# Patient Record
Sex: Female | Born: 1940 | Race: White | Hispanic: No | Marital: Married | Smoking: Never smoker
Health system: Southern US, Community
[De-identification: ages and names within clinical notes are randomized; demographics above are authoritative.]

## PROBLEM LIST (undated history)

## (undated) DIAGNOSIS — M8580 Other specified disorders of bone density and structure, unspecified site: Secondary | ICD-10-CM

## (undated) DIAGNOSIS — E559 Vitamin D deficiency, unspecified: Secondary | ICD-10-CM

## (undated) DIAGNOSIS — Q899 Congenital malformation, unspecified: Secondary | ICD-10-CM

## (undated) DIAGNOSIS — K219 Gastro-esophageal reflux disease without esophagitis: Secondary | ICD-10-CM

## (undated) DIAGNOSIS — K589 Irritable bowel syndrome without diarrhea: Secondary | ICD-10-CM

## (undated) DIAGNOSIS — G839 Paralytic syndrome, unspecified: Secondary | ICD-10-CM

## (undated) DIAGNOSIS — T7840XA Allergy, unspecified, initial encounter: Secondary | ICD-10-CM

## (undated) HISTORY — DX: Congenital malformation, unspecified: Q89.9

## (undated) HISTORY — DX: Irritable bowel syndrome, unspecified: K58.9

## (undated) HISTORY — DX: Allergy, unspecified, initial encounter: T78.40XA

## (undated) HISTORY — PX: CATARACT EXTRACTION: SUR2

## (undated) HISTORY — PX: CHOLECYSTECTOMY: SHX55

## (undated) HISTORY — DX: Vitamin D deficiency, unspecified: E55.9

## (undated) HISTORY — DX: Gastro-esophageal reflux disease without esophagitis: K21.9

## (undated) HISTORY — DX: Paralytic syndrome, unspecified: G83.9

## (undated) HISTORY — DX: Other specified disorders of bone density and structure, unspecified site: M85.80

---

## 1999-01-05 ENCOUNTER — Emergency Department (HOSPITAL_COMMUNITY): Admission: EM | Admit: 1999-01-05 | Discharge: 1999-01-05 | Payer: Self-pay | Admitting: *Deleted

## 1999-01-05 ENCOUNTER — Encounter: Payer: Self-pay | Admitting: Emergency Medicine

## 2001-02-26 ENCOUNTER — Emergency Department (HOSPITAL_COMMUNITY): Admission: EM | Admit: 2001-02-26 | Discharge: 2001-02-27 | Payer: Self-pay | Admitting: Emergency Medicine

## 2001-02-26 ENCOUNTER — Encounter: Payer: Self-pay | Admitting: Emergency Medicine

## 2002-05-03 ENCOUNTER — Encounter: Payer: Self-pay | Admitting: Orthopedic Surgery

## 2002-05-03 ENCOUNTER — Encounter: Admission: RE | Admit: 2002-05-03 | Discharge: 2002-05-03 | Payer: Self-pay | Admitting: Orthopedic Surgery

## 2003-07-12 ENCOUNTER — Other Ambulatory Visit: Admission: RE | Admit: 2003-07-12 | Discharge: 2003-07-12 | Payer: Self-pay | Admitting: Family Medicine

## 2003-10-12 ENCOUNTER — Ambulatory Visit (HOSPITAL_COMMUNITY): Admission: RE | Admit: 2003-10-12 | Discharge: 2003-10-12 | Payer: Self-pay | Admitting: Gastroenterology

## 2003-12-28 ENCOUNTER — Encounter: Admission: RE | Admit: 2003-12-28 | Discharge: 2003-12-28 | Payer: Self-pay | Admitting: Specialist

## 2004-05-25 HISTORY — PX: JOINT REPLACEMENT: SHX530

## 2005-03-16 ENCOUNTER — Inpatient Hospital Stay (HOSPITAL_COMMUNITY): Admission: RE | Admit: 2005-03-16 | Discharge: 2005-03-21 | Payer: Self-pay | Admitting: Orthopedic Surgery

## 2005-03-21 ENCOUNTER — Ambulatory Visit: Payer: Self-pay | Admitting: Physical Medicine & Rehabilitation

## 2005-03-21 ENCOUNTER — Inpatient Hospital Stay
Admission: RE | Admit: 2005-03-21 | Discharge: 2005-03-28 | Payer: Self-pay | Admitting: Physical Medicine & Rehabilitation

## 2005-07-05 ENCOUNTER — Emergency Department (HOSPITAL_COMMUNITY): Admission: EM | Admit: 2005-07-05 | Discharge: 2005-07-05 | Payer: Self-pay | Admitting: Emergency Medicine

## 2005-12-11 ENCOUNTER — Ambulatory Visit: Payer: Self-pay | Admitting: Family Medicine

## 2006-03-30 ENCOUNTER — Ambulatory Visit: Payer: Self-pay | Admitting: Family Medicine

## 2006-04-07 ENCOUNTER — Ambulatory Visit: Payer: Self-pay | Admitting: Family Medicine

## 2006-09-29 ENCOUNTER — Ambulatory Visit: Payer: Self-pay | Admitting: Family Medicine

## 2006-09-30 ENCOUNTER — Encounter: Admission: RE | Admit: 2006-09-30 | Discharge: 2006-09-30 | Payer: Self-pay | Admitting: Family Medicine

## 2007-06-13 ENCOUNTER — Ambulatory Visit: Payer: Self-pay | Admitting: Family Medicine

## 2007-06-27 ENCOUNTER — Ambulatory Visit: Payer: Self-pay | Admitting: Family Medicine

## 2007-08-08 ENCOUNTER — Ambulatory Visit: Payer: Self-pay | Admitting: Family Medicine

## 2007-09-26 ENCOUNTER — Ambulatory Visit: Payer: Self-pay | Admitting: Family Medicine

## 2007-09-26 ENCOUNTER — Other Ambulatory Visit: Admission: RE | Admit: 2007-09-26 | Discharge: 2007-09-26 | Payer: Self-pay | Admitting: Family Medicine

## 2007-10-07 ENCOUNTER — Ambulatory Visit: Payer: Self-pay | Admitting: Family Medicine

## 2008-05-25 DIAGNOSIS — E559 Vitamin D deficiency, unspecified: Secondary | ICD-10-CM

## 2008-05-25 HISTORY — DX: Vitamin D deficiency, unspecified: E55.9

## 2008-07-27 ENCOUNTER — Emergency Department (HOSPITAL_COMMUNITY): Admission: EM | Admit: 2008-07-27 | Discharge: 2008-07-27 | Payer: Self-pay | Admitting: Emergency Medicine

## 2008-09-26 ENCOUNTER — Ambulatory Visit: Payer: Self-pay | Admitting: Family Medicine

## 2008-11-28 ENCOUNTER — Ambulatory Visit: Payer: Self-pay | Admitting: Family Medicine

## 2009-04-09 ENCOUNTER — Ambulatory Visit: Payer: Self-pay | Admitting: Family Medicine

## 2009-06-10 ENCOUNTER — Ambulatory Visit: Payer: Self-pay | Admitting: Family Medicine

## 2009-08-08 ENCOUNTER — Ambulatory Visit: Payer: Self-pay | Admitting: Family Medicine

## 2009-08-19 ENCOUNTER — Encounter: Payer: Self-pay | Admitting: Cardiology

## 2009-09-25 ENCOUNTER — Ambulatory Visit: Payer: Self-pay | Admitting: Physician Assistant

## 2009-10-30 ENCOUNTER — Ambulatory Visit: Payer: Self-pay | Admitting: Family Medicine

## 2009-12-02 ENCOUNTER — Ambulatory Visit: Payer: Self-pay | Admitting: Family Medicine

## 2010-01-14 ENCOUNTER — Ambulatory Visit: Payer: Self-pay | Admitting: Family Medicine

## 2010-05-02 ENCOUNTER — Ambulatory Visit: Payer: Self-pay | Admitting: Family Medicine

## 2010-07-07 ENCOUNTER — Ambulatory Visit (INDEPENDENT_AMBULATORY_CARE_PROVIDER_SITE_OTHER): Payer: MEDICARE | Admitting: Physician Assistant

## 2010-07-07 DIAGNOSIS — E559 Vitamin D deficiency, unspecified: Secondary | ICD-10-CM

## 2010-07-07 DIAGNOSIS — J309 Allergic rhinitis, unspecified: Secondary | ICD-10-CM

## 2010-08-27 ENCOUNTER — Ambulatory Visit (INDEPENDENT_AMBULATORY_CARE_PROVIDER_SITE_OTHER): Payer: MEDICARE | Admitting: Family Medicine

## 2010-08-27 DIAGNOSIS — J309 Allergic rhinitis, unspecified: Secondary | ICD-10-CM

## 2010-08-27 DIAGNOSIS — R05 Cough: Secondary | ICD-10-CM

## 2010-09-13 ENCOUNTER — Encounter: Payer: Self-pay | Admitting: Family Medicine

## 2010-09-13 DIAGNOSIS — N390 Urinary tract infection, site not specified: Secondary | ICD-10-CM | POA: Insufficient documentation

## 2010-09-13 DIAGNOSIS — K529 Noninfective gastroenteritis and colitis, unspecified: Secondary | ICD-10-CM | POA: Insufficient documentation

## 2010-09-19 ENCOUNTER — Ambulatory Visit
Admission: RE | Admit: 2010-09-19 | Discharge: 2010-09-19 | Disposition: A | Discharge status: Home / Self Care | Payer: Medicare Other | Source: Ambulatory Visit | Attending: Family Medicine | Admitting: Family Medicine

## 2010-09-19 ENCOUNTER — Other Ambulatory Visit: Payer: Self-pay | Admitting: Family Medicine

## 2010-09-19 DIAGNOSIS — R05 Cough: Secondary | ICD-10-CM

## 2010-10-09 ENCOUNTER — Ambulatory Visit (INDEPENDENT_AMBULATORY_CARE_PROVIDER_SITE_OTHER): Payer: MEDICARE | Admitting: Family Medicine

## 2010-10-09 ENCOUNTER — Other Ambulatory Visit (HOSPITAL_COMMUNITY)
Admission: RE | Admit: 2010-10-09 | Discharge: 2010-10-09 | Disposition: A | Discharge status: Home / Self Care | Payer: Medicare Other | Source: Ambulatory Visit | Attending: Family Medicine | Admitting: Family Medicine

## 2010-10-09 ENCOUNTER — Encounter: Payer: Self-pay | Admitting: Family Medicine

## 2010-10-09 ENCOUNTER — Encounter: Payer: Medicare Other | Admitting: Family Medicine

## 2010-10-09 VITALS — BP 154/60 | HR 64 | Ht 62.0 in | Wt 172.0 lb

## 2010-10-09 DIAGNOSIS — M899 Disorder of bone, unspecified: Secondary | ICD-10-CM

## 2010-10-09 DIAGNOSIS — Z79899 Other long term (current) drug therapy: Secondary | ICD-10-CM

## 2010-10-09 DIAGNOSIS — K219 Gastro-esophageal reflux disease without esophagitis: Secondary | ICD-10-CM

## 2010-10-09 DIAGNOSIS — Z124 Encounter for screening for malignant neoplasm of cervix: Secondary | ICD-10-CM | POA: Insufficient documentation

## 2010-10-09 DIAGNOSIS — M8580 Other specified disorders of bone density and structure, unspecified site: Secondary | ICD-10-CM

## 2010-10-09 DIAGNOSIS — E559 Vitamin D deficiency, unspecified: Secondary | ICD-10-CM

## 2010-10-09 DIAGNOSIS — Z0000 Encounter for general adult medical examination without abnormal findings: Secondary | ICD-10-CM

## 2010-10-09 LAB — POCT URINALYSIS DIPSTICK
Blood, UA: NEGATIVE
Glucose, UA: NEGATIVE
Spec Grav, UA: 1.01
Urobilinogen, UA: NEGATIVE

## 2010-10-09 LAB — COMPREHENSIVE METABOLIC PANEL
ALT: 10 U/L (ref 0–35)
Albumin: 4.1 g/dL (ref 3.5–5.2)
CO2: 24 mEq/L (ref 19–32)
Calcium: 9.3 mg/dL (ref 8.4–10.5)
Chloride: 104 mEq/L (ref 96–112)
Creat: 0.79 mg/dL (ref 0.40–1.20)
Potassium: 4.4 mEq/L (ref 3.5–5.3)
Total Protein: 6.7 g/dL (ref 6.0–8.3)

## 2010-10-09 MED ORDER — MELOXICAM 7.5 MG PO TABS: 7.5000 mg | ORAL_TABLET | Freq: Two times a day (BID) | ORAL | Status: DC

## 2010-10-09 MED ORDER — RISEDRONATE SODIUM 35 MG PO TABS: 35.0000 mg | ORAL_TABLET | ORAL | Status: DC

## 2010-10-09 NOTE — Progress Notes (Signed)
Subjective:    Patient ID: [name], female    DOB: April 23, 1941, 70 y.o.   MRN: [id number]  HPI [name] is a 70 y.o. female who presents for a complete physical.  She has the following concerns: None.  Due for labs, needs copies of labs sent to Dr. Despina Hick   Immunization History  Administered Date(s) Administered  . DTaP 05/07/1992, 05/25/1994, 07/09/2003  . Pneumococcal Conjugate 03/19/2005  . Zoster 04/05/2010   Gets flu shots annually Last Pap smear: 2009 Last mammogram: August 2011 Last colonoscopy: approx 2006 per pt, with Dr. Loreta Ave Last DEXA: 01/2010 Ophtho: scheduled in near future Dentist: last year Exercise: Rides exercise bike 30 minutes, at least 2 times a day (over an hour a day) Lipids 09/2009 normal (LDL always <130)   Review of Systems The patient denies anorexia, fever, weight changes, headaches,  vision changes, decreased hearing, ear pain, sore throat, breast concerns, chest pain, palpitations, dizziness, syncope, dyspnea on exertion, cough, swelling, nausea, vomiting, diarrhea, constipation, abdominal pain, melena, hematochezia, indigestion/heartburn, hematuria, incontinence, dysuria, irregular menstrual cycles, vaginal discharge, odor or itch, genital lesions, numbness, tingling, weakness, tremor, suspicious skin lesions, depression, anxiety, abnormal bleeding/bruising, or enlarged lymph nodes.  +arthritis in knees     Objective:   Physical Exam BP 154/60  Pulse 64  Ht 5\' 2"  (1.575 m)  Wt 172 lb (78.019 kg)  BMI 31.46 kg/m2  General Appearance:    Alert, cooperative, no distress, appears stated age.  Frequent throat clearing  Head:    Normocephalic, without obvious abnormality, atraumatic  Eyes:    PERRL, conjunctiva/corneas clear, EOM's intact, fundi    benign  Ears:    Normal TM's and external ear canals  Nose:   Nares normal, mucosa normal, no drainage or sinus   tenderness  Throat:   Lips, mucosa, and tongue normal; teeth and gums normal    Neck:   Supple, no lymphadenopathy;  thyroid:  no   enlargement/tenderness/nodules; no carotid   bruit or JVD  Back:    Spine nontender, no curvature, ROM normal, no CVA     tenderness  Lungs:     Clear to auscultation bilaterally without wheezes, rales or     ronchi; respirations unlabored  Chest Wall:    No tenderness or deformity   Heart:    Regular rate and rhythm, S1 and S2 normal, no murmur, rub   or gallop  Breast Exam:    No tenderness, masses, or nipple discharge or inversion.      No axillary lymphadenopathy  Abdomen:     Soft, non-tender, nondistended, normoactive bowel sounds,    no masses, no hepatosplenomegaly  Genitalia:    Normal external genitalia without lesions.  BUS and vagina normal; cervix without lesions, or cervical motion tenderness. Mild atrophic changes. No abnormal vaginal discharge.  Uterus and adnexa not enlarged, nontender, no masses.  Pap performed  Rectal:    Normal tone, no masses or tenderness; guaiac negative stool  Extremities:   No clubbing, cyanosis or edema. Very limited ROM of L shoulder (<90 degrees)  Pulses:   2+ and symmetric all extremities  Skin:   Skin color, texture, turgor normal, no rashes or lesions  Lymph nodes:   Cervical, supraclavicular, and axillary nodes normal  Neurologic:   CNII-XII intact, reflexes 2+ and symmetric throughout. Alert and oriented x 3          Psych:   Normal mood, affect, hygiene and grooming.  Assessment & Plan:   1. Routine general medical examination at a health care facility  POCT urinalysis dipstick, Visual acuity screening, Cytology - PAP  2. Unspecified vitamin D deficiency  Vitamin D 25 hydroxy  3. GERD (gastroesophageal reflux disease)    4. Osteopenia    5. Encounter for long-term (current) use of other medications  Comprehensive metabolic panel   BP elevated today (although lower on repeat, still elevated).  BP was normal at her last 2 visits.  Will continue to monitor.  Low sodium diet  reviewed. Allergies--causing PND and throat clearing. Discussed OTC antihistamines such as Claritin, avoid decongestants; use Mucinex prn  Send copies of labs to Dr. Despina Hick Hemoccult kit given to patient.  Discussed monthly self breast exams and yearly mammograms after the age of 58; at least 30 minutes of aerobic activity at least 5 days/week; proper sunscreen use reviewed; healthy diet, including goals of calcium and vitamin D intake and alcohol recommendations (less than or equal to 1 drink/day) reviewed; regular seatbelt use; changing batteries in smoke detectors.  Immunization recommendations discussed.  Colonoscopy recommendations reviewed

## 2010-10-10 ENCOUNTER — Encounter: Payer: Self-pay | Admitting: Family Medicine

## 2010-10-10 DIAGNOSIS — M8580 Other specified disorders of bone density and structure, unspecified site: Secondary | ICD-10-CM | POA: Insufficient documentation

## 2010-10-10 DIAGNOSIS — E559 Vitamin D deficiency, unspecified: Secondary | ICD-10-CM | POA: Insufficient documentation

## 2010-10-10 LAB — VITAMIN D 25 HYDROXY (VIT D DEFICIENCY, FRACTURES): Vit D, 25-Hydroxy: 18 ng/mL — ABNORMAL LOW (ref 30–89)

## 2010-10-10 NOTE — Op Note (Signed)
NAME:  [name], [name]                          ACCOUNT NO.:  [id number]   MEDICAL RECORD NO.:  [id number]                   PATIENT TYPE:  AMB   LOCATION:  ENDO                                 FACILITY:  [facility]   PHYSICIAN:  Anselmo Rod, M.D.               DATE OF BIRTH:  Sep 25, 1940   DATE OF PROCEDURE:  10/12/2003  DATE OF DISCHARGE:                                 OPERATIVE REPORT   PROCEDURE:  Screening colonoscopy.   INSTRUMENT USED:  Olympus video colonoscope.   INDICATIONS FOR PROCEDURE:  This is a 70 year old white female undergoing a  screening colonoscopy to rule out colonic polyps, masses, etc. She received  preparation.  Informed consent was secured from the patient.  The patient  was fasted for 8 hours prior to the procedure and prepped with a bottle of  magnesium citrate and a gallon of [place] the night prior to the  procedure.   PRE-PROCEDURE DIAGNOSIS:  Stable vital signs.  Neck supple.  Chest clear to  auscultation.  S1, S2 regular.  Abdomen is soft with normal bowel sounds.   PROCEDURE:  The patient is placed in the left lateral decubitus position and  sedated with 8 mg of morphine plus 2 mg of Versed intravenously.  Once the  patient was adequately sedated, maintained on low-flow oxygen and continuous  cardiac monitoring, the Olympus video colonoscope was advanced through the  rectum to the cecum with extreme difficulty.  The patient's position was  changed from the left lateral to the supine and the right lateral position,  with application of abdominal pressure on multiple occasions to reach the  cecum. The appendiceal orifice was visualized and so was the IC valve.  No  masses, polyps, erosions, ulcerations or diverticula were seen.  Retroflexion in the rectum revealed no masses.  The patient tolerated the  procedure well and without complications.   IMPRESSION:  Normal colonoscopy to the cecum except for a very tortuous  colon.  No mass, polyp or  diverticula seen.   RECOMMENDATIONS:  1. Repeat CRC screening as recommended for age over 42, unless the patient     develops any abdominal symptoms in the interim.  2. Outpatient followup is advised in the future.  Continue a high-fiber diet     appropriate fluid intake.                                               Anselmo Rod, M.D.    JNM/MEDQ  D:  10/12/2003  T:  10/14/2003  Job:  [id number]   cc:   Milon Dikes MD Elpidio Galea PA/  [facility]

## 2010-10-10 NOTE — H&P (Signed)
NAMEIMARI, [name] NO.:  [id number]   MEDICAL RECORD NO.:  [id number]          PATIENT TYPE:  ORB   LOCATION:  4505                         FACILITY:  [facility]   PHYSICIAN:  Erick Colace, M.D.DATE OF BIRTH:  1940/11/03   DATE OF ADMISSION:  03/21/2005  DATE OF DISCHARGE:                                HISTORY & PHYSICAL   HISTORY:  A 70 year old female admitted to [facility], March 16, 2005,  with end-stage changes in the left knee, DJD.  No relief with conservative  care, underwent a left TKA, March 16, 2005, per Dr. Ollen Gross.  Placed  on Coumadin for DVT prophylaxis and made weightbearing as tolerated,  postoperative anemia of 9.0 has been monitored.   REVIEW OF SYSTEMS:  Positive for joint swelling and reflux symptoms.   PAST MEDICAL HISTORY:  1.  GERD.  2.  History of vertebral fracture.  3.  History of left shoulder fracture.  4.  History of birth injury, left upper extremity, causing limitation of the      left shoulder as well as a shortened left arm, but no sensation changes.  5.  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  She does not recall whether this is cerebral palsy or some      other condition.  6.  Osteopenia.   PAST SURGICAL HISTORY:  1.  Cholecystectomy.  2.  Partial colectomy.  3.  Appendectomy.  4.  Heel cord lengthening on the right lower extremity.   FAMILY HISTORY:  Positive for hypertension.   SOCIAL HISTORY:  She lives with her husband and good family support.   FUNCTIONAL HISTORY:  Independent prior to admission.   FUNCTIONAL STATUS:  Needs assistance with ADLs and mobility and toileting.   HOME MEDICATIONS:  Actonel, aspirin, Os-Cal, and Omeprazole.   LABORATORY DATA:  Last hemoglobin 9.0, last BUN 9, and creatinine 0.9.  Last  INR 1.9.   PHYSICAL EXAMINATION:  VITAL SIGNS:  Blood pressure 113/66, heart rate 111,  O2 SAT 100% room air, temp 98.2.  NECK:  No JVD.  LUNGS:  Clear.  HEART:  Mild tachycardia, has been doing therapy.  ABDOMEN:  Positive bowel sounds, soft and nontender.  EXTREMITIES:  The left lower extremity has minimal peri-incisional erythema,  no induration, no joint swelling.  Motor strength is 2- in the left deltoid,  3- at the biceps, and the grip, right side, is 5 in the same muscle groups.  The left lower extremity has 1 at the hip flexor, 2- at the knee extensor,  and 4 in the ankle dorsiflexor.  The right lower extremity is 4- in the hip  flexor, knee extensor, and 2- at the ankle dorsiflexor.  Sensation intact,  bilateral upper and lower extremities.   IMPRESSION:  1.  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  2.  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  3.  Chronic right lower extremity spastic paresis, question cerebral palsy.  4.  History of gastroesophageal reflux disease.  Continue Protonix.  5.  Postoperative anemia.  Monitor, recheck hemoglobin in the morning.  6.  Deep vein thrombosis prophylaxis on Coumadin.   ESTIMATED LENGTH OF STAY:  More than likely to be 10 days given chronic  right lower extremity weakness as well as left shoulder limitation.   DISPOSITION:  Home with husband.  Patient is a good candidate for SACU  therapies.      Erick Colace, M.D.  Electronically Signed     AEK/MEDQ  D:  03/22/2005  T:  03/22/2005  Job:  [id number]   cc:   Ollen Gross, M.D.  Fax: [phone]

## 2010-10-10 NOTE — Op Note (Signed)
NAMEKYNLEE, KOENIGSBERG                ACCOUNT NO.:  [id number]   MEDICAL RECORD NO.:  [id number]          PATIENT TYPE:  INP   LOCATION:  0007                         FACILITY:  [facility]   PHYSICIAN:  Ollen Gross, M.D.    DATE OF BIRTH:  1940-11-08   DATE OF PROCEDURE:  03/16/2005  DATE OF DISCHARGE:                                 OPERATIVE REPORT   PREOPERATIVE DIAGNOSIS:  Osteoarthritis left knee.   POSTOPERATIVE DIAGNOSIS:  Osteoarthritis left knee.   PROCEDURE:  Left total knee arthroplasty.   SURGEON:  Dr. Lequita Halt   ASSISTANT:  Avel Peace, PA-C   ANESTHESIA:  General with postop Marcaine pain pump.   ESTIMATED BLOOD LOSS:  Minimal.   DRAINS:  Hemovac x 1.   TOURNIQUET TIME:  41 minutes at 300 mmHg.   COMPLICATIONS:  None.   CONDITION:  Stable to recovery.   BRIEF CLINICAL NOTE:  Ms. Alcaraz is a 70 year old female with severe  patellofemoral arthritis.  She has had several courses of Visicol supplement  injections but most recently has had no relief of the worsening pain.  She  has failed nonoperative management and presents now for left total knee  arthroplasty.   PROCEDURE IN DETAIL:  After the successful administration of general  anesthetic, a tourniquet is placed high on the left thigh and left lower  extremity prepped and draped in the usual sterile fashion.  Extremity is  wrapped in Esmarch, knee flexed, and tourniquet inflated to 300 mmHg.  Standard midline incision is made with a 10 blade through subcutaneous  tissue to the level of the extensor mechanism.  A fresh blade is used to  make a medial parapatellar arthrotomy.  Then the soft tissue over the  proximal medial tibia is subperiosteally elevated to the joint line with a  knife and into the semimembranosus bursa with a Cobb elevator.  Soft tissue  over the proximal and lateral tibia is also elevated with attention being  paid to avoiding the patellar tendon on tibial tubercle.  The patella was  everted, knee flexed 90 degrees, and ACL and PCL removed.  Drill was used to  create a starting hole in the distal femur, and canal was irrigated.  A 5-  degree left valgus alignment guide is placed and referencing off the  posterior condyles, rotation is marked and the block pinned to remove 10 mm  off the distal femur.  Distal femoral resection is made with an oscillating  saw.  The sizing block is placed, and size 3 is most appropriate.  The  rotation is marked off the epicondylar axis.  Size 3 cutting block is  placed, and the anterior, posterior, and chamfer cuts are made.   Tibia is subluxed forward and the menisci are removed.  Extramedullary  tibial alignment guide is placed, referencing proximally at the medial  aspect of the tibial tubercle and distally along the second metatarsal axis  and tibial crest.  The block is pinned to remove 10 mm off the non deficient  lateral side.  Tibial resection is made with an oscillating saw.  Size  3 is  the most appropriate tibial component.  Then the proximal tibia is prepared  with the modular drill and keel punch for a size 3.  Femoral preparation is  completed with the intercondylar cut.   Size 3 mobile bearing tibial tray with a size 3 posterior stabilized femoral  trial and a 10 mm posterior stabilized rotating platform insert trial  placed.  With the 10, full extension is achieved with excellent varus and  valgus balance throughout full range of motion.  The patella was then  everted, thickness measured to be 20 mm.  Freehand resection is taken to 10  mm.  A 35 template is placed, lug holes are drilled, trial patella is  placed, and it tracks normally.  The osteophytes are then removed off the  posterior femur with the trials in place.  All trials are removed, and the  cut bone surfaces are prepared with pulsatile lavage.  Cement is mixed and  once ready for implantation, the size 3 mobile bearing tibial tray, size 3  posterior  stabilized femur, and 35 patella are cemented into place, and the  patella is held with the clamp.  Trial 10 mm insert is placed, knee held in  full extension and all extruded cement removed.  Once the cement is fully  hardened, then the permanent 10 mm posterior stabilized rotating platform  insert is placed into the tibial tray.  The wound is copiously irrigated  with saline solution and the extensor mechanism closed over a Hemovac drain  with interrupted #1 PDS.  Flexion against gravity is about 125 degrees.  Subcu is closed with interrupted 2-0 Vicryl subcuticular running 4-0  Monocryl, and the catheter for the Marcaine pain pump is placed, and the  pump is initiated.  Steri-Strips and a bulky sterile dressing are applied,  and she is subsequently placed into a knee immobilizer, awakened, and  transported to recovery in stable condition.      Ollen Gross, M.D.  Electronically Signed     FA/MEDQ  D:  03/16/2005  T:  03/16/2005  Job:  [id number]

## 2010-10-10 NOTE — Discharge Summary (Signed)
NAMERAY, GLACKEN NO.:  [id number]   MEDICAL RECORD NO.:  [id number]          PATIENT TYPE:  ORB   LOCATION:  4505                         FACILITY:  [facility]   PHYSICIAN:  Ellwood Dense, M.D.   DATE OF BIRTH:  06-15-1940   DATE OF ADMISSION:  03/21/2005  DATE OF DISCHARGE:  03/28/2005                                 DISCHARGE SUMMARY   DISCHARGE DIAGNOSES:  1.  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  2.  Pain management.  3.  Coumadin for deep venous thrombosis prophylaxis.  4.  Postoperative anemia.  5.  Gastroesophageal reflux disease.  6.  Left shoulder fracture x2.   HISTORY OF PRESENT ILLNESS:  This is a 70 year old female admitted to [facility] March 16, 2005, with end-stage changes to the left knee and  no relief with conservative care.  Underwent a left total knee arthroplasty  March 16, 2005, per Dr. Lequita Halt.  Placed on Coumadin for deep venous  thrombosis prophylaxis, weightbearing as tolerated.  Postoperative anemia  9.0 and monitored.  She was admitted to subacute care services.   PAST MEDICAL HISTORY:  See discharge diagnoses.  No alcohol or tobacco.   ALLERGIES:  SULFA.   SOCIAL HISTORY:  Lives with husband, good support of family.   MEDICATIONS PRIOR TO ADMISSION:  1.  Actonel.  2.  Aspirin.  3.  Os-Cal.  4.  Prilosec.   HOSPITAL COURSE:  The patient with progressive gains while in rehab services  with therapies initiated daily.  The following issues are followed during  the patient's rehab course.  Pertaining to Mrs. Konigsberg's left total knee  arthroplasty March 16, 2005, surgical site healing nicely with Steri-  Strips in place followed by orthopedic service, Dr. Ollen Gross.  She was  ambulating extended distances with a walker, essentially independent to  standby assistance in all areas of activities of daily living.  Home health  therapies had been arranged.   Pain management ongoing with the use of  oxycodone and Robaxin with good results.  She remained on Coumadin for deep  venous thrombosis prophylaxis with latest INR of 2.2.  She will complete  Coumadin protocol April 16, 2005, and resume her aspirin therapy as prior  to hospital admission.  [facility] had been arranged for  necessary Coumadin therapy.  Postoperative anemia stable with iron  supplement.  Latest hemoglobin of 8.6, hematocrit 25.3.  There were no  bleeding episodes, no orthostatic blood pressure changes.  She had no bowel  or bladder disturbances.  She was discharged to home in stable condition.   DISCHARGE MEDICATIONS:  At time of dictation:  1.  Coumadin 5 mg one tablet daily until April 16, 2005, then stop.  2.  Trinsicon one tablet twice daily.  3.  Robaxin 500 mg q.6h. p.r.n. spasms.  4.  Oxycodone 5 mg one or two tablets q.4h. p.r.n. pain.  5.  Prilosec 20 mg daily.  6.  She was advised to resume her aspirin therapy after Coumadin  completed.   She would follow up with Dr. Ollen Gross, [phone], as advised.  Home  health nursing had been arranged with [facility] to complete  Coumadin protocol with next INR on Monday, March 30, 2005.      Mariam Dollar, P.A.    ______________________________  Ellwood Dense, M.D.    DA/MEDQ  D:  03/27/2005  T:  03/27/2005  Job:  [id number]   cc:   Ollen Gross, M.D.  Fax: [phone]

## 2010-10-10 NOTE — H&P (Signed)
[name]                ACCOUNT NO.:  [id number]   MEDICAL RECORD NO.:  [id number]           PATIENT TYPE:   LOCATION:                                 FACILITY:   PHYSICIAN:  Ollen Gross, M.D.         DATE OF BIRTH:   DATE OF ADMISSION:  03/16/2005  DATE OF DISCHARGE:                                HISTORY & PHYSICAL   CHIEF COMPLAINT:  Left knee pain.   HISTORY OF PRESENT ILLNESS:  This 70 year old female has been seen by Dr.  Lequita Halt for ongoing left knee pain. She has been treated conservatively in  the past with cortisone injections and also recently halogen injection which  did not provide much relief.  She is at the point now where her knee is  hurting all the time. The predictable means of improvement in her  functioning would be a total knee replacement.  Risks and benefits have been  discussed, and she elected to proceed with surgery.   ALLERGIES:  SULFA medications.   CURRENT MEDICATIONS:  Actonel, Cataflam, aspirin, calcium, glucosamine DS,  and omeprazole.   PAST MEDICAL HISTORY:  1.  Acid reflux disease.  2.  History of a vertebral fracture.  3.  History of left shoulder fracture x2.  4.  Osteopenia.   PAST SURGICAL HISTORY:  1.  Tubal ligation.  2.  Appendectomy in 1968.  3.  Gallbladder surgery with a partial colectomy in 1983.  4.  Right foot cyst excision in 1984.  5.  Heel cord surgery in 1996.   SOCIAL HISTORY:  Married.  Nonsmoker, no alcohol. She has 8 children, 6 of  which are alive; 2 are deceased.  Husband will assist with care after  surgery.   FAMILY HISTORY:  She has one brother with lung cancer, one brother with  liver cancer, mother with history of stroke. Father with history of stroke.   REVIEW OF SYSTEMS:  GENERAL:  No fever, chills, or night sweats.  NEUROLOGIC: No seizure or paralysis. RESPIRATORY: No shortness of breath,  productive cough, hemoptysis. CARDIOVASCULAR: No chest pain, angina,  orthopnea. GI: No nausea,  vomiting, diarrhea, constipation.  GU: No dysuria,  hematuria, discharge, although she did have a recent preoperative  asymptomatic UTI which was treated with 3 doses of CIPRO.  She did not  tolerate the Cipro.  We will recheck her urinalysis postoperatively.  MUSCULOSKELETAL: Left knee pain.   PHYSICAL EXAMINATION:  VITAL SIGNS:  Pulse 84, respirations 12, blood  pressure 136/72.  GENERAL:  A 70 year old white female, well-nourished, well-developed, no  acute distress.  She is awake, alert, cooperative, pleasant, accompanied by  her husband.  HEENT:  Normocephalic and atraumatic.  Pupils equal, round, and reactive.  Oropharynx clear.  EOMs intact.  Glasses.  NECK:  Supple, no bruits.  CHEST:  Clear.  HEART:  Regular rate and rhythm.  No murmur, S1, or S2 noted.  ABDOMEN:  Soft, nontender.  Bowel sounds present.  RECTAL/BREASTS/GENITALIA: Not done, not pertinent to present illness.  EXTREMITIES:  Left knee shows no effusion.  Range of motion  5 to 115  degrees.  Marked crepitus noted on passive range of motion.   IMPRESSION:  1.  Osteoarthritis left knee.  2.  Acid reflux disease.  3.  Osteopenia.  4.  History of vertebral fracture.  5.  History of left shoulder x2.   PLAN:  The patient will be admitted to [facility] to undergo a  left total knee replacement arthroplasty.  Surgery will be performed by Dr.  Ollen Gross.  Her medical doctor is Dr. Sharlot Gowda.  He will be notified  of room number on admission and consulted if needed for medical assistance  with patient throughout the hospital course.      [name], P.A.      Ollen Gross, M.D.  Electronically Signed    ALP/MEDQ  D:  03/15/2005  T:  03/15/2005  Job:  [id number]   cc:   Sharlot Gowda, M.D.  Fax: [phone]   Ollen Gross, M.D.  Fax: [phone]

## 2010-10-10 NOTE — Discharge Summary (Signed)
NAMEKENDI, [name]                ACCOUNT NO.:  [id number]   MEDICAL RECORD NO.:  [id number]          PATIENT TYPE:  INP   LOCATION:  1504                         FACILITY:  [facility]   PHYSICIAN:  Ollen Gross, M.D.    DATE OF BIRTH:  06/27/1940   DATE OF ADMISSION:  03/16/2005  DATE OF DISCHARGE:  03/21/2005                                 DISCHARGE SUMMARY   ADMISSION DIAGNOSES:  1.  Osteoarthritis of the left knee.  2.  Acid reflux disease.  3.  Osteopenia.  4.  History of vertebral fractures.  5.  History of left shoulder fracture x2.   DISCHARGE DIAGNOSES:  1.  Osteoarthritis left knee status post left total knee arthroplasty.  2.  Postoperative blood loss anemia did not require transfusion.  3.  Postoperative hyponatremia, improved.  4.  Postoperative hypokalemia, improved.  5.  Acid reflux disease.  6.  Osteopenia.  7.  History of vertebral fractures.  8.  History of left shoulder fracture x2.   PROCEDURE:  March 16, 2005 left total knee, surgeon Dr. Lequita Halt, assistant  Avel Peace, PA-C. Anesthesia general, postop Marcaine pain pump, hemovac  drain x1. Tourniquet time 41 minutes.   CONSULTATIONS:  Rehab services, Dr. Ellwood Dense.   BRIEF HISTORY:  [name] is a 70 year old female with severe  patellofemoral arthritis whose had several courses of viscosupplementation  but no relief with increasing pain. She has failed nonoperative management  and now presents for a total knee.   LABORATORY DATA:  Preop CBC, hemoglobin 13.2, hematocrit 38.6, normal  differential. Postop hemoglobin 9.6, drifted down to 9.4, stabilized at 9.0  and 25.9. PT and PTT preop 13.4 and 26 respectively with a normal INR of  1.0. Serial pro times followed, last noted PT/INR 21.2 and 1.8. Chem panel  on admission all within normal limits. Serial BMETs were followed. Sodium  dropped from 140 down to 131, back up to 135. Potassium dropped from 3.5 to  3.3 back up to 3.9. Preop UA negative  with the exception of moderate  leukocyte esterase with 0-2 white cells or epithelial. Blood group type A  negative. Preop chest x-ray dated March 10, 2005, no active  cardiopulmonary disease, compression fracture likely of L1. Preop Cardiolite  dated December 18, 2004, normal adenosine Cardiolite, no evidence of ischemia  and there is normal LV function.   HOSPITAL COURSE:  Admitted to [facility], taken to the OR,  underwent the above procedure well. Started on PCA and p.o. analgesics. Was  able to get some sleep that first night and was doing pretty well by the  next day. Did have a drop in hemoglobin down to 9.6 felt to be some  hemodilution due to her positive fluid balance. Iron was added, labs were  followed. Fluids were reduced, hemovac drain was pulled. Started getting up  out of bed with physical therapy. The patient was seen in consultation by  Dr. Ellwood Dense on the rehab services who felt that she would be  appropriate for inpatient rehab versus [facility] . By day 2, the patient was  doing a little bit better, more awake, did have a little bit of  lightheadedness on the evening of day 1 but that had resolved. Her  hemoglobin was down to 9.4 but again on round she was asymptomatic with the  exception of the lightheadedness the night before. Fluids were discontinued,  did have some electrolyte imbalance with drop in sodium and potassium. K-Dur  was added, labs were rechecked and the potassium improved along with the  sodium. From a therapy standpoint by day 2, she started getting up a little  bit but required moderate assist and it was felt that she would be  appropriate for [facility] rehab due to the slow progression. By day 3, that is  when the rehab consult was ordered as stated above. Electrolytes were  improving, continued to be followed by rehab services and watching her  progression. She continued to have slow progression with her therapy only  ambulating approximately 8  feet by day 4. Hemoglobin stabilized at 9.0. She  did not have any symptoms. She was on iron supplements. Hopeful for a bed.  Dressing change initiated on day 2. Incision was healing well, followed  throughout the hospital course. By March 21, 2005, the patient was doing  well, had some very slow progression with physical therapy. It was noted  that a bed opened up on rehab [facility] , felt to be an excellent candidate for  transfer at that time.   PLAN:  1.  The patient transferred to [facility] .  2.  Discharge diagnoses, please see above.  3.  Discharge meds - continue current medications as per the [facility]. Will be      sent over with the patient.  4.  Diet - continue current diet.  5.  Activity - she is weightbearing as tolerated, total knee protocol.      Continue gait training ambulation as per PT and OT while in rehab [facility]      services.  6.  Followup - 2 weeks from surgery or following discharge from the [facility]  unit.   DISPOSITION:  [place] [facility] .   CONDITION ON DISCHARGE:  Slowly improving.      [name], P.A.      Ollen Gross, M.D.  Electronically Signed    ALP/MEDQ  D:  05/07/2005  T:  05/08/2005  Job:  [id number]   cc:   Sharlot Gowda, M.D.  Fax: [phone]   Ellwood Dense, M.D.  Fax: [phone]

## 2010-10-13 ENCOUNTER — Telehealth: Payer: Self-pay | Admitting: Family Medicine

## 2010-10-13 ENCOUNTER — Encounter: Payer: Self-pay | Admitting: Family Medicine

## 2010-10-13 NOTE — Telephone Encounter (Signed)
Phoned [facility] [phone] [place] Vit d 50,000 iu x 12 weeks 1 q week.  Pt aware

## 2010-10-13 NOTE — Telephone Encounter (Signed)
Message copied by Laureen Ochs on Mon Oct 13, 2010 10:37 AM ------      Message from: Joselyn Arrow      Created: Fri Oct 10, 2010 11:12 AM       Advise pt Vit D level is very low.  Rx 50,000 IU once a week x 12 weeks.  Once she completes the weekly Rx Vit D, she should change over to OTC Vitamin D and take an additional 1000 IU daily (on top of what is in her calcium and other vitamins).  Re-check Vit D and c-met in 6 months (v58.69).  Please send copy of c-met to Dr. Despina Hick (ortho)

## 2010-10-13 NOTE — Progress Notes (Signed)
Phoned pt no answer.  Sent copy to Dr. Josephina Gip

## 2010-10-30 ENCOUNTER — Other Ambulatory Visit: Payer: Medicare Other

## 2010-10-30 DIAGNOSIS — K921 Melena: Secondary | ICD-10-CM

## 2010-10-31 NOTE — Progress Notes (Signed)
Pt had 3 neg hemoccult cards. Per Ivor Messier

## 2010-11-03 ENCOUNTER — Encounter: Payer: Self-pay | Admitting: Family Medicine

## 2010-11-03 LAB — POC HEMOCCULT BLD/STL (HOME/3-CARD/SCREEN): Card #2 Fecal Occult Blod, POC: NEGATIVE

## 2010-11-03 NOTE — Progress Notes (Signed)
Addended by: Debbrah Alar F on: 11/03/2010 09:02 AM   Modules accepted: Orders

## 2010-11-07 ENCOUNTER — Emergency Department (HOSPITAL_COMMUNITY)
Admission: EM | Admit: 2010-11-07 | Discharge: 2010-11-07 | Disposition: A | Discharge status: Home / Self Care | Payer: Medicare Other | Attending: Emergency Medicine | Admitting: Emergency Medicine

## 2010-11-07 ENCOUNTER — Emergency Department (HOSPITAL_COMMUNITY): Payer: Medicare Other

## 2010-11-07 DIAGNOSIS — S42253A Displaced fracture of greater tuberosity of unspecified humerus, initial encounter for closed fracture: Secondary | ICD-10-CM | POA: Insufficient documentation

## 2010-11-07 DIAGNOSIS — W19XXXA Unspecified fall, initial encounter: Secondary | ICD-10-CM | POA: Insufficient documentation

## 2010-11-07 DIAGNOSIS — S42293A Other displaced fracture of upper end of unspecified humerus, initial encounter for closed fracture: Secondary | ICD-10-CM | POA: Insufficient documentation

## 2010-11-07 DIAGNOSIS — M25569 Pain in unspecified knee: Secondary | ICD-10-CM | POA: Insufficient documentation

## 2010-11-07 DIAGNOSIS — S0180XA Unspecified open wound of other part of head, initial encounter: Secondary | ICD-10-CM | POA: Insufficient documentation

## 2010-11-07 DIAGNOSIS — S0990XA Unspecified injury of head, initial encounter: Secondary | ICD-10-CM | POA: Insufficient documentation

## 2010-11-07 DIAGNOSIS — M25519 Pain in unspecified shoulder: Secondary | ICD-10-CM | POA: Insufficient documentation

## 2010-11-07 DIAGNOSIS — Y92009 Unspecified place in unspecified non-institutional (private) residence as the place of occurrence of the external cause: Secondary | ICD-10-CM | POA: Insufficient documentation

## 2010-11-27 ENCOUNTER — Other Ambulatory Visit: Payer: Self-pay | Admitting: Orthopedic Surgery

## 2010-11-27 DIAGNOSIS — M25511 Pain in right shoulder: Secondary | ICD-10-CM

## 2010-11-28 ENCOUNTER — Ambulatory Visit
Admission: RE | Admit: 2010-11-28 | Discharge: 2010-11-28 | Disposition: A | Discharge status: Home / Self Care | Payer: Medicare Other | Source: Ambulatory Visit | Attending: Orthopedic Surgery | Admitting: Orthopedic Surgery

## 2010-11-28 DIAGNOSIS — M25511 Pain in right shoulder: Secondary | ICD-10-CM

## 2011-04-13 ENCOUNTER — Other Ambulatory Visit (INDEPENDENT_AMBULATORY_CARE_PROVIDER_SITE_OTHER): Payer: Medicare Other

## 2011-04-13 DIAGNOSIS — Z23 Encounter for immunization: Secondary | ICD-10-CM

## 2011-04-14 LAB — HM MAMMOGRAPHY: HM Mammogram: NEGATIVE

## 2011-04-15 ENCOUNTER — Encounter: Payer: Self-pay | Admitting: Family Medicine

## 2011-05-11 ENCOUNTER — Other Ambulatory Visit: Payer: Self-pay | Admitting: Family Medicine

## 2011-05-11 NOTE — Telephone Encounter (Signed)
Okay to refill, but needs b-met scheduled (if pt takes med every day, which I believes she does)

## 2011-05-11 NOTE — Telephone Encounter (Signed)
Is this ok to refill?  

## 2011-05-22 ENCOUNTER — Other Ambulatory Visit: Payer: Self-pay

## 2011-05-25 ENCOUNTER — Telehealth: Payer: Self-pay | Admitting: *Deleted

## 2011-05-25 ENCOUNTER — Other Ambulatory Visit: Payer: Medicare Other

## 2011-05-25 DIAGNOSIS — Z79899 Other long term (current) drug therapy: Secondary | ICD-10-CM

## 2011-05-25 NOTE — Telephone Encounter (Signed)
Dr.Knapp, Pt was in this am for labs because she takes Meloxicam daily. In a prior telephone call you noted that she needed a BMET, is this what you would like ordered? Questioning whether or not you would like liver enzymes or Bmet? Please advise. Thanks.

## 2011-05-25 NOTE — Telephone Encounter (Signed)
Just wanted to let you know, I just went ahead and changed order to CMET, just to be safe. Thanks.

## 2011-05-25 NOTE — Telephone Encounter (Signed)
Noted.  c-met fine

## 2011-05-26 LAB — COMPREHENSIVE METABOLIC PANEL
ALT: 10 U/L (ref 0–35)
AST: 14 U/L (ref 0–37)
CO2: 29 mEq/L (ref 19–32)
Calcium: 9 mg/dL (ref 8.4–10.5)
Chloride: 105 mEq/L (ref 96–112)
Potassium: 4.2 mEq/L (ref 3.5–5.3)
Sodium: 145 mEq/L (ref 135–145)
Total Protein: 6.1 g/dL (ref 6.0–8.3)

## 2011-05-27 ENCOUNTER — Encounter: Payer: Self-pay | Admitting: Family Medicine

## 2011-06-30 ENCOUNTER — Telehealth: Payer: Self-pay | Admitting: Family Medicine

## 2011-06-30 NOTE — Telephone Encounter (Signed)
DONE

## 2011-07-23 DIAGNOSIS — M949 Disorder of cartilage, unspecified: Secondary | ICD-10-CM | POA: Diagnosis not present

## 2011-07-23 DIAGNOSIS — M899 Disorder of bone, unspecified: Secondary | ICD-10-CM | POA: Diagnosis not present

## 2011-07-23 DIAGNOSIS — K219 Gastro-esophageal reflux disease without esophagitis: Secondary | ICD-10-CM | POA: Diagnosis not present

## 2011-08-21 ENCOUNTER — Encounter: Payer: Self-pay | Admitting: *Deleted

## 2011-10-22 DIAGNOSIS — Z0000 Encounter for general adult medical examination without abnormal findings: Secondary | ICD-10-CM | POA: Diagnosis not present

## 2011-10-22 DIAGNOSIS — E669 Obesity, unspecified: Secondary | ICD-10-CM | POA: Diagnosis not present

## 2011-10-22 DIAGNOSIS — M949 Disorder of cartilage, unspecified: Secondary | ICD-10-CM | POA: Diagnosis not present

## 2011-10-22 DIAGNOSIS — M899 Disorder of bone, unspecified: Secondary | ICD-10-CM | POA: Diagnosis not present

## 2011-10-22 DIAGNOSIS — K219 Gastro-esophageal reflux disease without esophagitis: Secondary | ICD-10-CM | POA: Diagnosis not present

## 2011-10-22 DIAGNOSIS — Z23 Encounter for immunization: Secondary | ICD-10-CM | POA: Diagnosis not present

## 2011-10-22 DIAGNOSIS — Z136 Encounter for screening for cardiovascular disorders: Secondary | ICD-10-CM | POA: Diagnosis not present

## 2011-12-03 DIAGNOSIS — M1710 Unilateral primary osteoarthritis, unspecified knee: Secondary | ICD-10-CM | POA: Diagnosis not present

## 2011-12-10 DIAGNOSIS — M1710 Unilateral primary osteoarthritis, unspecified knee: Secondary | ICD-10-CM | POA: Diagnosis not present

## 2011-12-18 DIAGNOSIS — M1710 Unilateral primary osteoarthritis, unspecified knee: Secondary | ICD-10-CM | POA: Diagnosis not present

## 2011-12-23 DIAGNOSIS — M1710 Unilateral primary osteoarthritis, unspecified knee: Secondary | ICD-10-CM | POA: Diagnosis not present

## 2011-12-30 DIAGNOSIS — M1710 Unilateral primary osteoarthritis, unspecified knee: Secondary | ICD-10-CM | POA: Diagnosis not present

## 2012-03-21 DIAGNOSIS — M7710 Lateral epicondylitis, unspecified elbow: Secondary | ICD-10-CM | POA: Diagnosis not present

## 2012-04-14 DIAGNOSIS — E559 Vitamin D deficiency, unspecified: Secondary | ICD-10-CM | POA: Diagnosis not present

## 2012-04-14 DIAGNOSIS — Z23 Encounter for immunization: Secondary | ICD-10-CM | POA: Diagnosis not present

## 2012-04-14 DIAGNOSIS — R030 Elevated blood-pressure reading, without diagnosis of hypertension: Secondary | ICD-10-CM | POA: Diagnosis not present

## 2012-04-14 DIAGNOSIS — M899 Disorder of bone, unspecified: Secondary | ICD-10-CM | POA: Diagnosis not present

## 2012-04-14 DIAGNOSIS — M949 Disorder of cartilage, unspecified: Secondary | ICD-10-CM | POA: Diagnosis not present

## 2012-04-15 DIAGNOSIS — M899 Disorder of bone, unspecified: Secondary | ICD-10-CM | POA: Diagnosis not present

## 2012-04-15 DIAGNOSIS — Z1231 Encounter for screening mammogram for malignant neoplasm of breast: Secondary | ICD-10-CM | POA: Diagnosis not present

## 2012-07-28 DIAGNOSIS — M1710 Unilateral primary osteoarthritis, unspecified knee: Secondary | ICD-10-CM | POA: Diagnosis not present

## 2012-08-04 DIAGNOSIS — M1710 Unilateral primary osteoarthritis, unspecified knee: Secondary | ICD-10-CM | POA: Diagnosis not present

## 2012-08-10 DIAGNOSIS — M1710 Unilateral primary osteoarthritis, unspecified knee: Secondary | ICD-10-CM | POA: Diagnosis not present

## 2012-08-17 DIAGNOSIS — M1710 Unilateral primary osteoarthritis, unspecified knee: Secondary | ICD-10-CM | POA: Diagnosis not present

## 2012-08-24 DIAGNOSIS — M1710 Unilateral primary osteoarthritis, unspecified knee: Secondary | ICD-10-CM | POA: Diagnosis not present

## 2012-10-13 ENCOUNTER — Encounter: Payer: Self-pay | Admitting: Cardiology

## 2013-02-06 DIAGNOSIS — K219 Gastro-esophageal reflux disease without esophagitis: Secondary | ICD-10-CM | POA: Diagnosis not present

## 2013-02-06 DIAGNOSIS — Z0000 Encounter for general adult medical examination without abnormal findings: Secondary | ICD-10-CM | POA: Diagnosis not present

## 2013-02-06 DIAGNOSIS — Z79899 Other long term (current) drug therapy: Secondary | ICD-10-CM | POA: Diagnosis not present

## 2013-02-06 DIAGNOSIS — Z1331 Encounter for screening for depression: Secondary | ICD-10-CM | POA: Diagnosis not present

## 2013-02-06 DIAGNOSIS — E559 Vitamin D deficiency, unspecified: Secondary | ICD-10-CM | POA: Diagnosis not present

## 2013-02-06 DIAGNOSIS — Z1239 Encounter for other screening for malignant neoplasm of breast: Secondary | ICD-10-CM | POA: Diagnosis not present

## 2013-02-06 DIAGNOSIS — R609 Edema, unspecified: Secondary | ICD-10-CM | POA: Diagnosis not present

## 2013-03-09 DIAGNOSIS — R197 Diarrhea, unspecified: Secondary | ICD-10-CM | POA: Diagnosis not present

## 2013-03-16 DIAGNOSIS — R197 Diarrhea, unspecified: Secondary | ICD-10-CM | POA: Diagnosis not present

## 2013-03-23 DIAGNOSIS — M549 Dorsalgia, unspecified: Secondary | ICD-10-CM | POA: Diagnosis not present

## 2013-03-23 DIAGNOSIS — R197 Diarrhea, unspecified: Secondary | ICD-10-CM | POA: Diagnosis not present

## 2013-03-23 DIAGNOSIS — Z23 Encounter for immunization: Secondary | ICD-10-CM | POA: Diagnosis not present

## 2013-03-23 DIAGNOSIS — L0292 Furuncle, unspecified: Secondary | ICD-10-CM | POA: Diagnosis not present

## 2013-03-30 DIAGNOSIS — M549 Dorsalgia, unspecified: Secondary | ICD-10-CM | POA: Diagnosis not present

## 2013-03-30 DIAGNOSIS — L0292 Furuncle, unspecified: Secondary | ICD-10-CM | POA: Diagnosis not present

## 2013-04-17 DIAGNOSIS — Z1231 Encounter for screening mammogram for malignant neoplasm of breast: Secondary | ICD-10-CM | POA: Diagnosis not present

## 2013-05-22 DIAGNOSIS — B029 Zoster without complications: Secondary | ICD-10-CM | POA: Diagnosis not present

## 2013-09-20 DIAGNOSIS — M1710 Unilateral primary osteoarthritis, unspecified knee: Secondary | ICD-10-CM | POA: Diagnosis not present

## 2013-09-27 DIAGNOSIS — M1710 Unilateral primary osteoarthritis, unspecified knee: Secondary | ICD-10-CM | POA: Diagnosis not present

## 2013-10-04 DIAGNOSIS — M1710 Unilateral primary osteoarthritis, unspecified knee: Secondary | ICD-10-CM | POA: Diagnosis not present

## 2013-10-11 DIAGNOSIS — M1710 Unilateral primary osteoarthritis, unspecified knee: Secondary | ICD-10-CM | POA: Diagnosis not present

## 2013-10-18 DIAGNOSIS — M1710 Unilateral primary osteoarthritis, unspecified knee: Secondary | ICD-10-CM | POA: Diagnosis not present

## 2013-11-02 ENCOUNTER — Emergency Department (HOSPITAL_COMMUNITY): Payer: MEDICARE

## 2013-11-02 ENCOUNTER — Encounter (HOSPITAL_COMMUNITY): Payer: Self-pay | Admitting: Emergency Medicine

## 2013-11-02 DIAGNOSIS — K219 Gastro-esophageal reflux disease without esophagitis: Secondary | ICD-10-CM | POA: Diagnosis not present

## 2013-11-02 DIAGNOSIS — S298XXA Other specified injuries of thorax, initial encounter: Secondary | ICD-10-CM | POA: Diagnosis not present

## 2013-11-02 DIAGNOSIS — S40019A Contusion of unspecified shoulder, initial encounter: Secondary | ICD-10-CM | POA: Diagnosis not present

## 2013-11-02 DIAGNOSIS — Z79899 Other long term (current) drug therapy: Secondary | ICD-10-CM | POA: Diagnosis not present

## 2013-11-02 DIAGNOSIS — E559 Vitamin D deficiency, unspecified: Secondary | ICD-10-CM | POA: Insufficient documentation

## 2013-11-02 DIAGNOSIS — Q740 Other congenital malformations of upper limb(s), including shoulder girdle: Secondary | ICD-10-CM | POA: Insufficient documentation

## 2013-11-02 DIAGNOSIS — M949 Disorder of cartilage, unspecified: Secondary | ICD-10-CM | POA: Diagnosis not present

## 2013-11-02 DIAGNOSIS — R079 Chest pain, unspecified: Secondary | ICD-10-CM | POA: Diagnosis not present

## 2013-11-02 DIAGNOSIS — S4980XA Other specified injuries of shoulder and upper arm, unspecified arm, initial encounter: Secondary | ICD-10-CM | POA: Diagnosis not present

## 2013-11-02 DIAGNOSIS — S6990XA Unspecified injury of unspecified wrist, hand and finger(s), initial encounter: Secondary | ICD-10-CM | POA: Diagnosis not present

## 2013-11-02 DIAGNOSIS — S59909A Unspecified injury of unspecified elbow, initial encounter: Secondary | ICD-10-CM | POA: Diagnosis not present

## 2013-11-02 DIAGNOSIS — S8990XA Unspecified injury of unspecified lower leg, initial encounter: Secondary | ICD-10-CM | POA: Diagnosis not present

## 2013-11-02 DIAGNOSIS — Z7982 Long term (current) use of aspirin: Secondary | ICD-10-CM | POA: Diagnosis not present

## 2013-11-02 DIAGNOSIS — M25569 Pain in unspecified knee: Secondary | ICD-10-CM | POA: Diagnosis not present

## 2013-11-02 DIAGNOSIS — M25529 Pain in unspecified elbow: Secondary | ICD-10-CM | POA: Diagnosis not present

## 2013-11-02 DIAGNOSIS — S8000XA Contusion of unspecified knee, initial encounter: Secondary | ICD-10-CM | POA: Diagnosis not present

## 2013-11-02 DIAGNOSIS — Y9389 Activity, other specified: Secondary | ICD-10-CM | POA: Insufficient documentation

## 2013-11-02 DIAGNOSIS — W010XXA Fall on same level from slipping, tripping and stumbling without subsequent striking against object, initial encounter: Secondary | ICD-10-CM | POA: Insufficient documentation

## 2013-11-02 DIAGNOSIS — Y929 Unspecified place or not applicable: Secondary | ICD-10-CM | POA: Insufficient documentation

## 2013-11-02 DIAGNOSIS — S5000XA Contusion of unspecified elbow, initial encounter: Secondary | ICD-10-CM | POA: Diagnosis not present

## 2013-11-02 DIAGNOSIS — M899 Disorder of bone, unspecified: Secondary | ICD-10-CM | POA: Insufficient documentation

## 2013-11-02 DIAGNOSIS — S46909A Unspecified injury of unspecified muscle, fascia and tendon at shoulder and upper arm level, unspecified arm, initial encounter: Secondary | ICD-10-CM | POA: Diagnosis not present

## 2013-11-02 DIAGNOSIS — S99919A Unspecified injury of unspecified ankle, initial encounter: Secondary | ICD-10-CM | POA: Diagnosis not present

## 2013-11-02 DIAGNOSIS — Z791 Long term (current) use of non-steroidal anti-inflammatories (NSAID): Secondary | ICD-10-CM | POA: Insufficient documentation

## 2013-11-02 DIAGNOSIS — Z8669 Personal history of other diseases of the nervous system and sense organs: Secondary | ICD-10-CM | POA: Diagnosis not present

## 2013-11-02 NOTE — ED Notes (Signed)
The pt is c/o pain all over after she tripped and fell earlier tonight just before going to bed.   She is c/o lt knee and her greatest pain is in her lt shoulder  And her entire lt arm and her ribs just under her lt breast

## 2013-11-03 ENCOUNTER — Emergency Department (HOSPITAL_COMMUNITY): Payer: MEDICARE

## 2013-11-03 ENCOUNTER — Emergency Department (HOSPITAL_COMMUNITY)
Admission: EM | Admit: 2013-11-03 | Discharge: 2013-11-03 | Disposition: A | Discharge status: Home / Self Care | Payer: MEDICARE | Attending: Emergency Medicine | Admitting: Emergency Medicine

## 2013-11-03 DIAGNOSIS — S298XXA Other specified injuries of thorax, initial encounter: Secondary | ICD-10-CM | POA: Diagnosis not present

## 2013-11-03 DIAGNOSIS — S99929A Unspecified injury of unspecified foot, initial encounter: Secondary | ICD-10-CM | POA: Diagnosis not present

## 2013-11-03 DIAGNOSIS — S5002XA Contusion of left elbow, initial encounter: Secondary | ICD-10-CM

## 2013-11-03 DIAGNOSIS — S40012A Contusion of left shoulder, initial encounter: Secondary | ICD-10-CM

## 2013-11-03 DIAGNOSIS — S59909A Unspecified injury of unspecified elbow, initial encounter: Secondary | ICD-10-CM | POA: Diagnosis not present

## 2013-11-03 DIAGNOSIS — M25529 Pain in unspecified elbow: Secondary | ICD-10-CM | POA: Diagnosis not present

## 2013-11-03 DIAGNOSIS — S4980XA Other specified injuries of shoulder and upper arm, unspecified arm, initial encounter: Secondary | ICD-10-CM | POA: Diagnosis not present

## 2013-11-03 DIAGNOSIS — R079 Chest pain, unspecified: Secondary | ICD-10-CM | POA: Diagnosis not present

## 2013-11-03 DIAGNOSIS — S8002XA Contusion of left knee, initial encounter: Secondary | ICD-10-CM

## 2013-11-03 DIAGNOSIS — M25569 Pain in unspecified knee: Secondary | ICD-10-CM | POA: Diagnosis not present

## 2013-11-03 DIAGNOSIS — S8990XA Unspecified injury of unspecified lower leg, initial encounter: Secondary | ICD-10-CM | POA: Diagnosis not present

## 2013-11-03 DIAGNOSIS — S46909A Unspecified injury of unspecified muscle, fascia and tendon at shoulder and upper arm level, unspecified arm, initial encounter: Secondary | ICD-10-CM | POA: Diagnosis not present

## 2013-11-03 MED ORDER — HYDROCODONE-ACETAMINOPHEN 5-325 MG PO TABS: 1.0000 | ORAL_TABLET | ORAL | Status: DC | PRN

## 2013-11-03 MED ORDER — MORPHINE SULFATE 4 MG/ML IJ SOLN
4.0000 mg | Freq: Once | INTRAMUSCULAR | Status: AC
  Administered 2013-11-03: 4 mg via INTRAMUSCULAR
  Filled 2013-11-03: qty 1

## 2013-11-03 NOTE — ED Provider Notes (Signed)
CSN: [id number]     Arrival date & time 11/02/13  2319 History   First MD Initiated Contact with Patient 11/03/13 0148     Chief Complaint  Patient presents with  . Fall     (Consider location/radiation/quality/duration/timing/severity/associated sxs/prior Treatment) HPI Patient presents after a trip and fall landing on her left side or standing position. She denies any head or neck trauma. There was no syncope. No prior chest pain or shortness of breath. She complains of left shoulder left elbow and left knee pain. 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 Denies any focal weakness or numbness. Past Medical History  Diagnosis Date  . Allergy   . Cataract   . GERD (gastroesophageal reflux disease)   . IBS (irritable bowel syndrome)   . Osteopenia   . Paralysis since birth    RLE weakness; L shoulder weakness (h/o clavicle fracture at birth, limited ROM)  . Unspecified vitamin D deficiency 2010  . Congenital defect     left arm   Past Surgical History  Procedure Laterality Date  . Cholecystectomy    . Cataract extraction      both eyes  . Joint replacement  2006    L total knee (Dr. Despina HickAlusio)   Family History  Problem Relation Age of Onset  . Stroke Mother   . Vision loss Mother   . Vision loss Father   . Stroke Father   . Cancer Sister [phone]    breast cancer  . Vision loss Brother   . Diabetes Neg Hx   . Heart disease Neg Hx    History  Substance Use Topics  . Smoking status: Never Smoker   . Smokeless tobacco: Never Used  . Alcohol Use: No   OB History   Grav Para Term Preterm Abortions TAB SAB Ect Mult Living   8 8  1      6      Obstetric Comments   1 was pre-term and lived <2 hrs; another child died at 4 months, 6 days (lungs didn't develop properly)     Review of Systems  Constitutional: Negative for fever and chills.  HENT: Negative for facial swelling.   Respiratory: Negative for shortness of breath.    Cardiovascular: Negative for chest pain.  Gastrointestinal: Negative for nausea, vomiting and abdominal pain.  Musculoskeletal: Positive for arthralgias. Negative for neck pain and neck stiffness.  Skin: Negative for rash and wound.  Neurological: Negative for dizziness, syncope, weakness, light-headedness, numbness and headaches.  All other systems reviewed and are negative.     Allergies  Sulfa drugs cross reactors  Home Medications   Prior to Admission medications   Medication Sig Start Date End Date Taking? Authorizing Provider  aspirin 81 MG EC tablet Take 81 mg by mouth daily.     Yes Historical Provider, MD  Calcium-Vitamin D (CALTRATE 600 PLUS-VIT D PO) Take 2 tablets by mouth daily.     Yes Historical Provider, MD  Cholecalciferol (VITAMIN D) 1000 UNITS capsule Take 1,000 Units by mouth daily.     Yes Historical Provider, MD  DiphenhydrAMINE HCl (ALLERGY MED PO) Take 1 tablet by mouth daily as needed (allergies).   Yes Historical Provider, MD  Glucosamine-Chondroitin (COSAMIN DS) 500-400 MG CAPS Take 1 tablet by mouth daily.     Yes Historical Provider, MD  meloxicam (MOBIC) 7.5 MG tablet Take 7.5 mg by mouth 2 (two) times daily.   Yes Historical Provider, MD  omeprazole (PRILOSEC) 20 MG capsule Take 20 mg by mouth daily.     Yes Historical Provider, MD   BP 152/67  Pulse 65  Temp(Src) 98.1 F (36.7 C)  Resp 15  Ht 5\' 3"  (1.6 m)  Wt 169 lb (76.658 kg)  BMI 29.94 kg/m2  SpO2 100% Physical Exam  Nursing note and vitals reviewed. Constitutional: She is oriented to person, place, and time. She appears well-developed and well-nourished. No distress.  HENT:  Head: Normocephalic and atraumatic.  Mouth/Throat: Oropharynx is clear and moist.  Eyes: EOM are normal. Pupils are equal, round, and reactive to light.  Neck: Normal range of motion. Neck supple.  No posterior midline cervical tenderness to palpation.  Cardiovascular: Normal rate and regular rhythm.  Exam reveals  no gallop and no friction rub.   No murmur heard. Pulmonary/Chest: Effort normal and breath sounds normal. No respiratory distress. She has no wheezes. She has no rales. She exhibits tenderness (left lower rib tenderness to palpation both anteriorly and laterally. No crepitance or deformity.).  Abdominal: Soft. Bowel sounds are normal. She exhibits no distension and no mass. There is no tenderness. There is no rebound and no guarding.  Musculoskeletal: Normal range of motion. She exhibits no edema and no tenderness.  Chronic-appearing deformities of the left upper extremity. Cells in a contracted position against the patient's body. She had tenderness and decreased range of motion of the left elbow and shoulder. No obvious swelling. Distal pulses are intact. Patient has mild lateral left knee tenderness. There is no obvious effusion. No ligamentous instability.  Neurological: She is alert and oriented to person, place, and time.  Decreased range of motion of the left upper extremity due to pain and birth defect. No discernible focal weakness or numbness.  Skin: Skin is warm and dry. No rash noted. No erythema.  Psychiatric: She has a normal mood and affect. Her behavior is normal.    ED Course  Procedures (including critical care time) Labs Review Labs Reviewed - No data to display  Imaging Review Dg Ribs Unilateral W/chest Left  11/03/2013   CLINICAL DATA:  Left anterior rib pain after a fall.  EXAM: LEFT RIBS AND CHEST - 3+ VIEW  COMPARISON:  Chest 09/19/2010  FINDINGS: No fracture or other bone lesions are seen involving the ribs. There is no evidence of pneumothorax or pleural effusion. Both lungs are clear. Heart size and mediastinal contours are within normal limits. Degenerative changes in the shoulders and spine. Surgical clips in the right upper quadrant.  IMPRESSION: Negative.   Electronically Signed   By: Burman NievesWilliam  Stevens M.D.   On: 11/03/2013 00:11     EKG Interpretation None       MDM   Final diagnoses:  None      No acute findings on x-rays. Patient placed in sling and discharged home with pain medication. She's advised to followup with primary Dr. Return precautions given.  Loren Raceravid Bibiana Gillean, MD 11/03/13 [phone]

## 2013-11-03 NOTE — ED Notes (Signed)
MD at bedside. 

## 2013-11-03 NOTE — Discharge Instructions (Signed)

## 2013-11-16 DIAGNOSIS — J209 Acute bronchitis, unspecified: Secondary | ICD-10-CM | POA: Diagnosis not present

## 2013-11-16 DIAGNOSIS — R0789 Other chest pain: Secondary | ICD-10-CM | POA: Diagnosis not present

## 2014-01-23 DIAGNOSIS — R197 Diarrhea, unspecified: Secondary | ICD-10-CM | POA: Diagnosis not present

## 2014-01-26 DIAGNOSIS — R197 Diarrhea, unspecified: Secondary | ICD-10-CM | POA: Diagnosis not present

## 2014-01-26 DIAGNOSIS — Z1211 Encounter for screening for malignant neoplasm of colon: Secondary | ICD-10-CM | POA: Diagnosis not present

## 2014-01-27 ENCOUNTER — Encounter (HOSPITAL_COMMUNITY): Payer: Self-pay | Admitting: Emergency Medicine

## 2014-01-27 ENCOUNTER — Emergency Department (HOSPITAL_COMMUNITY)
Admission: EM | Admit: 2014-01-27 | Discharge: 2014-01-27 | Disposition: A | Discharge status: Home / Self Care | Payer: MEDICARE | Attending: Emergency Medicine | Admitting: Emergency Medicine

## 2014-01-27 DIAGNOSIS — Z8739 Personal history of other diseases of the musculoskeletal system and connective tissue: Secondary | ICD-10-CM | POA: Insufficient documentation

## 2014-01-27 DIAGNOSIS — N390 Urinary tract infection, site not specified: Secondary | ICD-10-CM | POA: Diagnosis not present

## 2014-01-27 DIAGNOSIS — Z8719 Personal history of other diseases of the digestive system: Secondary | ICD-10-CM | POA: Diagnosis not present

## 2014-01-27 DIAGNOSIS — Z7982 Long term (current) use of aspirin: Secondary | ICD-10-CM | POA: Insufficient documentation

## 2014-01-27 DIAGNOSIS — R197 Diarrhea, unspecified: Secondary | ICD-10-CM | POA: Insufficient documentation

## 2014-01-27 DIAGNOSIS — Q899 Congenital malformation, unspecified: Secondary | ICD-10-CM | POA: Insufficient documentation

## 2014-01-27 DIAGNOSIS — Z792 Long term (current) use of antibiotics: Secondary | ICD-10-CM | POA: Insufficient documentation

## 2014-01-27 DIAGNOSIS — Z862 Personal history of diseases of the blood and blood-forming organs and certain disorders involving the immune mechanism: Secondary | ICD-10-CM | POA: Insufficient documentation

## 2014-01-27 DIAGNOSIS — Z8639 Personal history of other endocrine, nutritional and metabolic disease: Secondary | ICD-10-CM | POA: Insufficient documentation

## 2014-01-27 DIAGNOSIS — Z8669 Personal history of other diseases of the nervous system and sense organs: Secondary | ICD-10-CM | POA: Insufficient documentation

## 2014-01-27 LAB — URINALYSIS, ROUTINE W REFLEX MICROSCOPIC
Bilirubin Urine: NEGATIVE
GLUCOSE, UA: NEGATIVE mg/dL
KETONES UR: NEGATIVE mg/dL
NITRITE: NEGATIVE
PROTEIN: NEGATIVE mg/dL
Specific Gravity, Urine: 1.014 (ref 1.005–1.030)
Urobilinogen, UA: 0.2 mg/dL (ref 0.0–1.0)
pH: 6 (ref 5.0–8.0)

## 2014-01-27 LAB — CBC WITH DIFFERENTIAL/PLATELET
Basophils Absolute: 0 10*3/uL (ref 0.0–0.1)
Basophils Relative: 0 % (ref 0–1)
EOS PCT: 4 % (ref 0–5)
Eosinophils Absolute: 0.2 10*3/uL (ref 0.0–0.7)
HEMATOCRIT: 34.9 % — AB (ref 36.0–46.0)
HEMOGLOBIN: 11.9 g/dL — AB (ref 12.0–15.0)
LYMPHS ABS: 1.6 10*3/uL (ref 0.7–4.0)
LYMPHS PCT: 33 % (ref 12–46)
MCH: 29.9 pg (ref 26.0–34.0)
MCHC: 34.1 g/dL (ref 30.0–36.0)
MCV: 87.7 fL (ref 78.0–100.0)
MONO ABS: 0.4 10*3/uL (ref 0.1–1.0)
MONOS PCT: 8 % (ref 3–12)
Neutro Abs: 2.7 10*3/uL (ref 1.7–7.7)
Neutrophils Relative %: 55 % (ref 43–77)
Platelets: 213 10*3/uL (ref 150–400)
RBC: 3.98 MIL/uL (ref 3.87–5.11)
RDW: 12.5 % (ref 11.5–15.5)
WBC: 4.9 10*3/uL (ref 4.0–10.5)

## 2014-01-27 LAB — COMPREHENSIVE METABOLIC PANEL
ALT: 10 U/L (ref 0–35)
AST: 15 U/L (ref 0–37)
Albumin: 3 g/dL — ABNORMAL LOW (ref 3.5–5.2)
Alkaline Phosphatase: 75 U/L (ref 39–117)
Anion gap: 13 (ref 5–15)
BUN: 9 mg/dL (ref 6–23)
CO2: 27 meq/L (ref 19–32)
CREATININE: 0.89 mg/dL (ref 0.50–1.10)
Calcium: 8.6 mg/dL (ref 8.4–10.5)
Chloride: 99 mEq/L (ref 96–112)
GFR, EST AFRICAN AMERICAN: 72 mL/min — AB (ref >90)
GFR, EST NON AFRICAN AMERICAN: 62 mL/min — AB (ref >90)
GLUCOSE: 101 mg/dL — AB (ref 70–99)
Potassium: 3.4 mEq/L — ABNORMAL LOW (ref 3.7–5.3)
Sodium: 139 mEq/L (ref 137–147)
Total Bilirubin: 0.3 mg/dL (ref 0.3–1.2)
Total Protein: 6.6 g/dL (ref 6.0–8.3)

## 2014-01-27 LAB — URINE MICROSCOPIC-ADD ON

## 2014-01-27 LAB — CLOSTRIDIUM DIFFICILE BY PCR: Toxigenic C. Difficile by PCR: NEGATIVE

## 2014-01-27 LAB — LIPASE, BLOOD: LIPASE: 27 U/L (ref 11–59)

## 2014-01-27 MED ORDER — POTASSIUM CHLORIDE CRYS ER 20 MEQ PO TBCR
40.0000 meq | EXTENDED_RELEASE_TABLET | Freq: Once | ORAL | Status: AC
  Administered 2014-01-27: 40 meq via ORAL
  Filled 2014-01-27: qty 2

## 2014-01-27 MED ORDER — SODIUM CHLORIDE 0.9 % IV BOLUS (SEPSIS)
1000.0000 mL | Freq: Once | INTRAVENOUS | Status: AC
  Administered 2014-01-27: 1000 mL via INTRAVENOUS

## 2014-01-27 MED ORDER — DIPHENOXYLATE-ATROPINE 2.5-0.025 MG PO TABS: 1.0000 | ORAL_TABLET | Freq: Four times a day (QID) | ORAL | Status: DC | PRN

## 2014-01-27 MED ORDER — DIPHENOXYLATE-ATROPINE 2.5-0.025 MG PO TABS
1.0000 | ORAL_TABLET | Freq: Once | ORAL | Status: AC
  Administered 2014-01-27: 1 via ORAL
  Filled 2014-01-27: qty 1

## 2014-01-27 NOTE — ED Notes (Signed)
Per pt sts 2 weeks of diarrhea and she feels weak and dehydrated. sts she has been incontinent and wearing depends. sts was started on multiple meds and not better. Lomotil and abx.

## 2014-01-27 NOTE — ED Provider Notes (Signed)
TIME SEEN: 4:35 PM  CHIEF COMPLAINT: Diarrhea  HPI: Patient is a 73 year old female with history of irritable bowel syndrome who presents to the emergency department with complaints of 2 weeks of diarrhea. She states that on August 23 she started with vomiting and diarrhea. Vomiting continued until the 24th and then resolve. She has had persistent diarrhea since. She states her stool is normal color she has approximately 5-10 episodes a day. She has felt weak and had chills. She has had decreased appetite. No documented fever. No nausea. No chest pain or shortness of breath. No bloody stool or melena. No dysuria or hematuria. No recent travel, hospitalization. No sick contacts. She was seen by her primary care physician, 4 days ago and yesterday. She was given Lomotil 4 days ago and states this has not helped with her diarrhea. She was started on Flagyl yesterday. She's had a normal colonoscopy approximately 10 years ago. She has an appointment to have a colonoscopy on September 27.   She reports in 1983 she had to have a small bowel resection because of her "intestines were twisted". She is also had a cholecystectomy. No other history of abdominal surgery.   ROS: See HPI Constitutional: no fever  Eyes: no drainage  ENT: no runny nose   Cardiovascular:  no chest pain  Resp: no SOB  GI: no vomiting GU: no dysuria Integumentary: no rash  Allergy: no hives  Musculoskeletal: no leg swelling  Neurological: no slurred speech ROS otherwise negative  PAST MEDICAL HISTORY/PAST SURGICAL HISTORY:  Past Medical History  Diagnosis Date  . Allergy   . Cataract   . GERD (gastroesophageal reflux disease)   . IBS (irritable bowel syndrome)   . Osteopenia   . Paralysis since birth    RLE weakness; L shoulder weakness (h/o clavicle fracture at birth, limited ROM)  . Unspecified vitamin D deficiency 2010  . Congenital defect     left arm    MEDICATIONS:  Prior to Admission medications    Medication Sig Start Date End Date Taking? Authorizing Provider  aspirin 81 MG EC tablet Take 81 mg by mouth every morning.    Yes Historical Provider, MD  metroNIDAZOLE (FLAGYL) 500 MG tablet Take 500 mg by mouth 3 (three) times daily. Started 01/26/14, for 7 days ending 02/01/14   Yes Historical Provider, MD    ALLERGIES:  Allergies  Allergen Reactions  . Sulfa Drugs Cross Reactors Rash    SOCIAL HISTORY:  History  Substance Use Topics  . Smoking status: Never Smoker   . Smokeless tobacco: Never Used  . Alcohol Use: No    FAMILY HISTORY: Family History  Problem Relation Age of Onset  . Stroke Mother   . Vision loss Mother   . Vision loss Father   . Stroke Father   . Cancer Sister 28    breast cancer  . Vision loss Brother   . Diabetes Neg Hx   . Heart disease Neg Hx     EXAM: BP 123/88  Pulse 83  Temp(Src) 97.9 F (36.6 C) (Oral)  Resp 18  SpO2 100% CONSTITUTIONAL: Alert and oriented and responds appropriately to questions. Well-appearing; well-nourished HEAD: Normocephalic EYES: Conjunctivae clear, PERRL ENT: normal nose; no rhinorrhea; slightly dry mucous membranes; pharynx without lesions noted NECK: Supple, no meningismus, no LAD  CARD: RRR; S1 and S2 appreciated; no murmurs, no clicks, no rubs, no gallops RESP: Normal chest excursion without splinting or tachypnea; breath sounds clear and equal bilaterally; no wheezes, no  rhonchi, no rales,  ABD/GI: Normal bowel sounds; non-distended; soft, non-tender, no rebound, no guarding, no guarding or rebound BACK:  The back appears normal and is non-tender to palpation, there is no CVA tenderness EXT: Normal ROM in all joints; non-tender to palpation; no edema; normal capillary refill; no cyanosis    SKIN: Normal color for age and race; warm NEURO: Moves all extremities equally PSYCH: The patient's mood and manner are appropriate. Grooming and personal hygiene are appropriate.  MEDICAL DECISION MAKING: Patient here  with persistent diarrhea. She is well-appearing with benign abdominal exam and hemodynamically stable. We'll give IV fluids and Lomotil for symptom relief. Her labs are unremarkable except for slightly low potassium level 3.4. We'll replace. We'll obtain urinalysis, stool cultures, PCR for c diff. I do not feel she needs CT imaging at this time. Discussed with patient that we may discharge her on Cipro as well as the Flagyl she is currently taking to see if if her symptoms. Have recommended close outpatient followup and encouraged her to keep an appointment for her colonoscopy. I anticipate that she will be discharged home. Patient is comfortable with this plan.  ED PROGRESS: Patient's urine shows trace leukocytes, hemoglobin bacteria but also squamous cells. May be a dirty catch. Culture pending. We'll discharge her with Cipro which were treated UTI and have her continue her Flagyl. She states she's feeling much better after IV fluids. Have recommended that patient eat whatever she feels like at home given she is not eating at all. Have advised her to stay away from greasy, spicy or heavy meals. Have recommended that she add boost or Ensure to her diet. We'll give a refill for Lomotil. We'll have her followup with her PCP and gastroenterologist as needed. Discussed supportive care instructions and return precautions. She verbalized understanding and is comfortable with this plan.     Layla Maw Javeah Loeza, DO 01/27/14 [phone]

## 2014-01-27 NOTE — Discharge Instructions (Signed)
Diarrhea Diarrhea is frequent loose and watery bowel movements. It can cause you to feel weak and dehydrated. Dehydration can cause you to become tired and thirsty, have a dry mouth, and have decreased urination that often is dark yellow. Diarrhea is a sign of another problem, most often an infection that will not last [place]. In most cases, diarrhea typically lasts 2-3 days. However, it can last longer if it is a sign of something more serious. It is important to treat your diarrhea as directed by your caregiver to lessen or prevent future episodes of diarrhea. CAUSES  Some common causes include:  Gastrointestinal infections caused by viruses, bacteria, or parasites.  Food poisoning or food allergies.  Certain medicines, such as antibiotics, chemotherapy, and laxatives.  Artificial sweeteners and fructose.  Digestive disorders. HOME CARE INSTRUCTIONS  Ensure adequate fluid intake (hydration): Have 1 cup (8 oz) of fluid for each diarrhea episode. Avoid fluids that contain simple sugars or sports drinks, fruit juices, whole milk products, and sodas. Your urine should be clear or pale yellow if you are drinking enough fluids. Hydrate with an oral rehydration solution that you can purchase at pharmacies, retail stores, and online. You can prepare an oral rehydration solution at home by mixing the following ingredients together:   - tsp table salt.   tsp baking soda.   tsp salt substitute containing potassium chloride.  1  tablespoons sugar.  1 L (34 oz) of water.  Certain foods and beverages may increase the speed at which food moves through the gastrointestinal (GI) tract. These foods and beverages should be avoided and include:  Caffeinated and alcoholic beverages.  High-fiber foods, such as raw fruits and vegetables, nuts, seeds, and whole grain breads and cereals.  Foods and beverages sweetened with sugar alcohols, such as xylitol, sorbitol, and mannitol.  Some foods may be well  tolerated and may help thicken stool including:  Starchy foods, such as rice, toast, pasta, low-sugar cereal, oatmeal, grits, baked potatoes, crackers, and bagels.  Bananas.  Applesauce.  Add probiotic-rich foods to help increase healthy bacteria in the GI tract, such as yogurt and fermented milk products.  Wash your hands well after each diarrhea episode.  Only take over-the-counter or prescription medicines as directed by your caregiver.  Take a warm bath to relieve any burning or pain from frequent diarrhea episodes. SEEK IMMEDIATE MEDICAL CARE IF:   You are unable to keep fluids down.  You have persistent vomiting.  You have blood in your stool, or your stools are black and tarry.  You do not urinate in 6-8 hours, or there is only a small amount of very dark urine.  You have abdominal pain that increases or localizes.  You have weakness, dizziness, confusion, or light-headedness.  You have a severe headache.  Your diarrhea gets worse or does not get better.  You have a fever or persistent symptoms for more than 2-3 days.  You have a fever and your symptoms suddenly get worse. MAKE SURE YOU:   Understand these instructions.  Will watch your condition.  Will get help right away if you are not doing well or get worse. Document Released: 05/01/2002 Document Revised: 09/25/2013 Document Reviewed: 01/17/2012 [facility] Patient Information 2015 [place], [place]. This information is not intended to replace advice given to you by your health care provider. Make sure you discuss any questions you have with your health care provider.  Food Choices to Help Relieve Diarrhea When you have diarrhea, the foods you eat and your  eating habits are very important. Choosing the right foods and drinks can help relieve diarrhea. Also, because diarrhea can last up to 7 days, you need to replace lost fluids and electrolytes (such as sodium, potassium, and chloride) in order to help prevent  dehydration.  WHAT GENERAL GUIDELINES DO I NEED TO FOLLOW?  Slowly drink 1 cup (8 oz) of fluid for each episode of diarrhea. If you are getting enough fluid, your urine will be clear or pale yellow.  Eat starchy foods. Some good choices include white rice, white toast, pasta, low-fiber cereal, baked potatoes (without the skin), saltine crackers, and bagels.  Avoid large servings of any cooked vegetables.  Limit fruit to two servings per day. A serving is  cup or 1 small piece.  Choose foods with less than 2 g of fiber per serving.  Limit fats to less than 8 tsp (38 g) per day.  Avoid fried foods.  Eat foods that have probiotics in them. Probiotics can be found in certain dairy products.  Avoid foods and beverages that may increase the speed at which food moves through the stomach and intestines (gastrointestinal tract). Things to avoid include:  High-fiber foods, such as dried fruit, raw fruits and vegetables, nuts, seeds, and whole grain foods.  Spicy foods and high-fat foods.  Foods and beverages sweetened with high-fructose corn syrup, honey, or sugar alcohols such as xylitol, sorbitol, and mannitol. WHAT FOODS ARE RECOMMENDED? Grains White rice. White, [place], or pita breads (fresh or toasted), including plain rolls, buns, or bagels. White pasta. Saltine, soda, or graham crackers. Pretzels. Low-fiber cereal. Cooked cereals made with water (such as cornmeal, farina, or cream cereals). Plain muffins. Matzo. Melba toast. Zwieback.  Vegetables Potatoes (without the skin). Strained tomato and vegetable juices. Most well-cooked and canned vegetables without seeds. Tender lettuce. Fruits Cooked or canned applesauce, apricots, cherries, fruit cocktail, grapefruit, peaches, pears, or plums. Fresh bananas, apples without skin, cherries, grapes, cantaloupe, grapefruit, peaches, oranges, or plums.  Meat and Other Protein Products Baked or boiled chicken. Eggs. Tofu. Fish. Seafood. Smooth  peanut butter. Ground or well-cooked tender beef, ham, veal, lamb, pork, or poultry.  Dairy Plain yogurt, kefir, and unsweetened liquid yogurt. Lactose-free milk, buttermilk, or soy milk. Plain hard cheese. Beverages Sport drinks. Clear broths. Diluted fruit juices (except prune). Regular, caffeine-free sodas such as ginger ale. Water. Decaffeinated teas. Oral rehydration solutions. Sugar-free beverages not sweetened with sugar alcohols. Other Bouillon, broth, or soups made from recommended foods.  The items listed above may not be a complete list of recommended foods or beverages. Contact your dietitian for more options. WHAT FOODS ARE NOT RECOMMENDED? Grains Whole grain, whole wheat, bran, or rye breads, rolls, pastas, crackers, and cereals. Wild or brown rice. Cereals that contain more than 2 g of fiber per serving. Corn tortillas or taco shells. Cooked or dry oatmeal. Granola. Popcorn. Vegetables Raw vegetables. Cabbage, broccoli, Brussels sprouts, artichokes, baked beans, beet greens, corn, kale, legumes, peas, sweet potatoes, and yams. Potato skins. Cooked spinach and cabbage. Fruits Dried fruit, including raisins and dates. Raw fruits. Stewed or dried prunes. Fresh apples with skin, apricots, mangoes, pears, raspberries, and strawberries.  Meat and Other Protein Products Chunky peanut butter. Nuts and seeds. Beans and lentils. Tomasa Blase.  Dairy High-fat cheeses. Milk, chocolate milk, and beverages made with milk, such as milk shakes. Cream. Ice cream. Sweets and Desserts Sweet rolls, doughnuts, and sweet breads. Pancakes and waffles. Fats and Oils Butter. Cream sauces. Margarine. Salad oils. Plain salad dressings. Olives.  Avocados.  Beverages Caffeinated beverages (such as coffee, tea, soda, or energy drinks). Alcoholic beverages. Fruit juices with pulp. Prune juice. Soft drinks sweetened with high-fructose corn syrup or sugar alcohols. Other Coconut. Hot sauce. Chili powder. Mayonnaise.  Gravy. Cream-based or milk-based soups.  The items listed above may not be a complete list of foods and beverages to avoid. Contact your dietitian for more information. WHAT SHOULD I DO IF I BECOME DEHYDRATED? Diarrhea can sometimes lead to dehydration. Signs of dehydration include dark urine and dry mouth and skin. If you think you are dehydrated, you should rehydrate with an oral rehydration solution. These solutions can be purchased at pharmacies, retail stores, or online.  Drink -1 cup (120-240 mL) of oral rehydration solution each time you have an episode of diarrhea. If drinking this amount makes your diarrhea worse, try drinking smaller amounts more often. For example, drink 1-3 tsp (5-15 mL) every 5-10 minutes.  A general rule for staying hydrated is to drink 1-2 L of fluid per day. Talk to your health care provider about the specific amount you should be drinking each day. Drink enough fluids to keep your urine clear or pale yellow. Document Released: 08/01/2003 Document Revised: 05/16/2013 Document Reviewed: 04/03/2013 [facility] Patient Information 2015 [place], [place]. This information is not intended to replace advice given to you by your health care provider. Make sure you discuss any questions you have with your health care provider.  Urinary Tract Infection Urinary tract infections (UTIs) can develop anywhere along your urinary tract. Your urinary tract is your body's drainage system for removing wastes and extra water. Your urinary tract includes two kidneys, two ureters, a bladder, and a urethra. Your kidneys are a pair of bean-shaped organs. Each kidney is about the size of your fist. They are located below your ribs, one on each side of your spine. CAUSES Infections are caused by microbes, which are microscopic organisms, including fungi, viruses, and bacteria. These organisms are so small that they can only be seen through a microscope. Bacteria are the microbes that most commonly  cause UTIs. SYMPTOMS  Symptoms of UTIs may vary by age and gender of the patient and by the location of the infection. Symptoms in young women typically include a frequent and intense urge to urinate and a painful, burning feeling in the bladder or urethra during urination. Older women and men are more likely to be tired, shaky, and weak and have muscle aches and abdominal pain. A fever may mean the infection is in your kidneys. Other symptoms of a kidney infection include pain in your back or sides below the ribs, nausea, and vomiting. DIAGNOSIS To diagnose a UTI, your caregiver will ask you about your symptoms. Your caregiver also will ask to provide a urine sample. The urine sample will be tested for bacteria and white blood cells. White blood cells are made by your body to help fight infection. TREATMENT  Typically, UTIs can be treated with medication. Because most UTIs are caused by a bacterial infection, they usually can be treated with the use of antibiotics. The choice of antibiotic and length of treatment depend on your symptoms and the type of bacteria causing your infection. HOME CARE INSTRUCTIONS  If you were prescribed antibiotics, take them exactly as your caregiver instructs you. Finish the medication even if you feel better after you have only taken some of the medication.  Drink enough water and fluids to keep your urine clear or pale yellow.  Avoid caffeine, tea, and  carbonated beverages. They tend to irritate your bladder.  Empty your bladder often. Avoid holding urine for long periods of time.  Empty your bladder before and after sexual intercourse.  After a bowel movement, women should cleanse from front to back. Use each tissue only once. SEEK MEDICAL CARE IF:   You have back pain.  You develop a fever.  Your symptoms do not begin to resolve within 3 days. SEEK IMMEDIATE MEDICAL CARE IF:   You have severe back pain or lower abdominal pain.  You develop  chills.  You have nausea or vomiting.  You have continued burning or discomfort with urination. MAKE SURE YOU:   Understand these instructions.  Will watch your condition.  Will get help right away if you are not doing well or get worse. Document Released: 02/18/2005 Document Revised: 11/10/2011 Document Reviewed: 06/19/2011 [facility] Patient Information 2015 [place], [place]. This information is not intended to replace advice given to you by your health care provider. Make sure you discuss any questions you have with your health care provider.

## 2014-01-31 ENCOUNTER — Telehealth (HOSPITAL_BASED_OUTPATIENT_CLINIC_OR_DEPARTMENT_OTHER): Payer: Self-pay | Admitting: Emergency Medicine

## 2014-01-31 LAB — STOOL CULTURE

## 2014-01-31 NOTE — Telephone Encounter (Signed)
Post ED Visit - Positive Culture Follow-up  Culture report reviewed by antimicrobial stewardship pharmacist:  Wes Dulaney, Pharm.D., BCPS  Celedonio Miyamoto, [address].D., BCPS  Georgina Pillion, Pharm.D., BCPS  Strang, [address].D., BCPS, AAHIVP  Estella Husk, Pharm.D., BCPS, AAHIVP  Carly Sabat, Pharm.D.  Enzo Bi, [address].D.  Positive urine culture 60,000 colonies/ml E. Coli Treated with Metronidazole  po tabs tid x 7 days, organism sensitive to the same and no further patient follow-up is required at this time.  Berle Mull 01/31/2014, 3:25 PM

## 2014-02-01 LAB — URINE CULTURE: Colony Count: 60000

## 2014-02-08 DIAGNOSIS — R198 Other specified symptoms and signs involving the digestive system and abdomen: Secondary | ICD-10-CM | POA: Diagnosis not present

## 2014-02-08 DIAGNOSIS — E876 Hypokalemia: Secondary | ICD-10-CM | POA: Diagnosis not present

## 2014-02-08 DIAGNOSIS — K219 Gastro-esophageal reflux disease without esophagitis: Secondary | ICD-10-CM | POA: Diagnosis not present

## 2014-02-08 DIAGNOSIS — R197 Diarrhea, unspecified: Secondary | ICD-10-CM | POA: Diagnosis not present

## 2014-02-08 DIAGNOSIS — Z1211 Encounter for screening for malignant neoplasm of colon: Secondary | ICD-10-CM | POA: Diagnosis not present

## 2014-02-12 DIAGNOSIS — K6389 Other specified diseases of intestine: Secondary | ICD-10-CM | POA: Diagnosis not present

## 2014-02-12 DIAGNOSIS — R198 Other specified symptoms and signs involving the digestive system and abdomen: Secondary | ICD-10-CM | POA: Diagnosis not present

## 2014-02-12 DIAGNOSIS — Z1211 Encounter for screening for malignant neoplasm of colon: Secondary | ICD-10-CM | POA: Diagnosis not present

## 2014-02-12 DIAGNOSIS — A0472 Enterocolitis due to Clostridium difficile, not specified as recurrent: Secondary | ICD-10-CM | POA: Diagnosis not present

## 2014-02-12 DIAGNOSIS — R634 Abnormal weight loss: Secondary | ICD-10-CM | POA: Diagnosis not present

## 2014-02-12 DIAGNOSIS — R197 Diarrhea, unspecified: Secondary | ICD-10-CM | POA: Diagnosis not present

## 2014-02-27 DIAGNOSIS — A047 Enterocolitis due to Clostridium difficile: Secondary | ICD-10-CM | POA: Diagnosis not present

## 2014-03-07 DIAGNOSIS — M1990 Unspecified osteoarthritis, unspecified site: Secondary | ICD-10-CM | POA: Diagnosis not present

## 2014-03-07 DIAGNOSIS — M8580 Other specified disorders of bone density and structure, unspecified site: Secondary | ICD-10-CM | POA: Diagnosis not present

## 2014-03-07 DIAGNOSIS — Z0000 Encounter for general adult medical examination without abnormal findings: Secondary | ICD-10-CM | POA: Diagnosis not present

## 2014-03-07 DIAGNOSIS — Z23 Encounter for immunization: Secondary | ICD-10-CM | POA: Diagnosis not present

## 2014-03-07 DIAGNOSIS — Z1389 Encounter for screening for other disorder: Secondary | ICD-10-CM | POA: Diagnosis not present

## 2014-03-26 ENCOUNTER — Encounter (HOSPITAL_COMMUNITY): Payer: Self-pay | Admitting: Emergency Medicine

## 2014-04-18 DIAGNOSIS — Z1231 Encounter for screening mammogram for malignant neoplasm of breast: Secondary | ICD-10-CM | POA: Diagnosis not present

## 2014-04-18 DIAGNOSIS — Z803 Family history of malignant neoplasm of breast: Secondary | ICD-10-CM | POA: Diagnosis not present

## 2014-04-18 DIAGNOSIS — M8580 Other specified disorders of bone density and structure, unspecified site: Secondary | ICD-10-CM | POA: Diagnosis not present

## 2014-07-18 DIAGNOSIS — M1711 Unilateral primary osteoarthritis, right knee: Secondary | ICD-10-CM | POA: Diagnosis not present

## 2014-07-25 DIAGNOSIS — M1711 Unilateral primary osteoarthritis, right knee: Secondary | ICD-10-CM | POA: Diagnosis not present

## 2014-08-02 DIAGNOSIS — M1711 Unilateral primary osteoarthritis, right knee: Secondary | ICD-10-CM | POA: Diagnosis not present

## 2014-08-09 DIAGNOSIS — M1711 Unilateral primary osteoarthritis, right knee: Secondary | ICD-10-CM | POA: Diagnosis not present

## 2014-08-16 DIAGNOSIS — M1711 Unilateral primary osteoarthritis, right knee: Secondary | ICD-10-CM | POA: Diagnosis not present

## 2015-03-11 DIAGNOSIS — Z79899 Other long term (current) drug therapy: Secondary | ICD-10-CM | POA: Diagnosis not present

## 2015-03-11 DIAGNOSIS — K219 Gastro-esophageal reflux disease without esophagitis: Secondary | ICD-10-CM | POA: Diagnosis not present

## 2015-03-11 DIAGNOSIS — Z6830 Body mass index (BMI) 30.0-30.9, adult: Secondary | ICD-10-CM | POA: Diagnosis not present

## 2015-03-11 DIAGNOSIS — G809 Cerebral palsy, unspecified: Secondary | ICD-10-CM | POA: Diagnosis not present

## 2015-03-11 DIAGNOSIS — M19042 Primary osteoarthritis, left hand: Secondary | ICD-10-CM | POA: Diagnosis not present

## 2015-03-11 DIAGNOSIS — Z1389 Encounter for screening for other disorder: Secondary | ICD-10-CM | POA: Diagnosis not present

## 2015-03-11 DIAGNOSIS — Z0000 Encounter for general adult medical examination without abnormal findings: Secondary | ICD-10-CM | POA: Diagnosis not present

## 2015-03-11 DIAGNOSIS — Z23 Encounter for immunization: Secondary | ICD-10-CM | POA: Diagnosis not present

## 2015-03-11 DIAGNOSIS — M19041 Primary osteoarthritis, right hand: Secondary | ICD-10-CM | POA: Diagnosis not present

## 2015-03-27 DIAGNOSIS — R51 Headache: Secondary | ICD-10-CM | POA: Diagnosis not present

## 2015-04-05 DIAGNOSIS — R51 Headache: Secondary | ICD-10-CM | POA: Diagnosis not present

## 2015-04-05 DIAGNOSIS — R030 Elevated blood-pressure reading, without diagnosis of hypertension: Secondary | ICD-10-CM | POA: Diagnosis not present

## 2015-04-12 DIAGNOSIS — B0229 Other postherpetic nervous system involvement: Secondary | ICD-10-CM | POA: Diagnosis not present

## 2015-04-23 DIAGNOSIS — Z1231 Encounter for screening mammogram for malignant neoplasm of breast: Secondary | ICD-10-CM | POA: Diagnosis not present

## 2015-04-23 DIAGNOSIS — Z803 Family history of malignant neoplasm of breast: Secondary | ICD-10-CM | POA: Diagnosis not present

## 2015-07-03 DIAGNOSIS — M1711 Unilateral primary osteoarthritis, right knee: Secondary | ICD-10-CM | POA: Diagnosis not present

## 2015-07-10 DIAGNOSIS — M1711 Unilateral primary osteoarthritis, right knee: Secondary | ICD-10-CM | POA: Diagnosis not present

## 2015-07-17 DIAGNOSIS — M1711 Unilateral primary osteoarthritis, right knee: Secondary | ICD-10-CM | POA: Diagnosis not present

## 2015-07-24 DIAGNOSIS — M1711 Unilateral primary osteoarthritis, right knee: Secondary | ICD-10-CM | POA: Diagnosis not present

## 2015-07-31 ENCOUNTER — Other Ambulatory Visit: Payer: Self-pay | Admitting: Orthopedic Surgery

## 2015-07-31 DIAGNOSIS — M545 Low back pain, unspecified: Secondary | ICD-10-CM

## 2015-07-31 DIAGNOSIS — M1711 Unilateral primary osteoarthritis, right knee: Secondary | ICD-10-CM | POA: Diagnosis not present

## 2015-08-02 ENCOUNTER — Ambulatory Visit
Admission: RE | Admit: 2015-08-02 | Discharge: 2015-08-02 | Disposition: A | Discharge status: Home / Self Care | Payer: MEDICARE | Source: Ambulatory Visit | Attending: Orthopedic Surgery | Admitting: Orthopedic Surgery

## 2015-08-02 DIAGNOSIS — M4806 Spinal stenosis, lumbar region: Secondary | ICD-10-CM | POA: Diagnosis not present

## 2015-08-02 DIAGNOSIS — M545 Low back pain, unspecified: Secondary | ICD-10-CM

## 2015-08-13 DIAGNOSIS — M5136 Other intervertebral disc degeneration, lumbar region: Secondary | ICD-10-CM | POA: Diagnosis not present

## 2015-08-13 DIAGNOSIS — M545 Low back pain: Secondary | ICD-10-CM | POA: Diagnosis not present

## 2015-08-13 DIAGNOSIS — M4806 Spinal stenosis, lumbar region: Secondary | ICD-10-CM | POA: Diagnosis not present

## 2015-08-20 DIAGNOSIS — M545 Low back pain: Secondary | ICD-10-CM | POA: Diagnosis not present

## 2015-08-28 DIAGNOSIS — M545 Low back pain: Secondary | ICD-10-CM | POA: Diagnosis not present

## 2015-09-03 DIAGNOSIS — M4806 Spinal stenosis, lumbar region: Secondary | ICD-10-CM | POA: Diagnosis not present

## 2015-09-03 DIAGNOSIS — M545 Low back pain: Secondary | ICD-10-CM | POA: Diagnosis not present

## 2015-09-03 DIAGNOSIS — M5136 Other intervertebral disc degeneration, lumbar region: Secondary | ICD-10-CM | POA: Diagnosis not present

## 2015-11-25 DIAGNOSIS — M545 Low back pain: Secondary | ICD-10-CM | POA: Diagnosis not present

## 2015-11-25 DIAGNOSIS — M5136 Other intervertebral disc degeneration, lumbar region: Secondary | ICD-10-CM | POA: Diagnosis not present

## 2015-11-25 DIAGNOSIS — M4806 Spinal stenosis, lumbar region: Secondary | ICD-10-CM | POA: Diagnosis not present

## 2016-01-21 DIAGNOSIS — M5136 Other intervertebral disc degeneration, lumbar region: Secondary | ICD-10-CM | POA: Diagnosis not present

## 2016-03-16 DIAGNOSIS — E559 Vitamin D deficiency, unspecified: Secondary | ICD-10-CM | POA: Diagnosis not present

## 2016-03-16 DIAGNOSIS — Z1389 Encounter for screening for other disorder: Secondary | ICD-10-CM | POA: Diagnosis not present

## 2016-03-16 DIAGNOSIS — Z79899 Other long term (current) drug therapy: Secondary | ICD-10-CM | POA: Diagnosis not present

## 2016-03-16 DIAGNOSIS — K219 Gastro-esophageal reflux disease without esophagitis: Secondary | ICD-10-CM | POA: Diagnosis not present

## 2016-03-16 DIAGNOSIS — Z5181 Encounter for therapeutic drug level monitoring: Secondary | ICD-10-CM | POA: Diagnosis not present

## 2016-03-16 DIAGNOSIS — Z0000 Encounter for general adult medical examination without abnormal findings: Secondary | ICD-10-CM | POA: Diagnosis not present

## 2016-03-18 DIAGNOSIS — M5136 Other intervertebral disc degeneration, lumbar region: Secondary | ICD-10-CM | POA: Diagnosis not present

## 2016-03-31 DIAGNOSIS — Z23 Encounter for immunization: Secondary | ICD-10-CM | POA: Diagnosis not present

## 2016-04-23 DIAGNOSIS — Z803 Family history of malignant neoplasm of breast: Secondary | ICD-10-CM | POA: Diagnosis not present

## 2016-04-23 DIAGNOSIS — Z1231 Encounter for screening mammogram for malignant neoplasm of breast: Secondary | ICD-10-CM | POA: Diagnosis not present

## 2016-04-23 DIAGNOSIS — M8589 Other specified disorders of bone density and structure, multiple sites: Secondary | ICD-10-CM | POA: Diagnosis not present

## 2016-06-26 DIAGNOSIS — M1711 Unilateral primary osteoarthritis, right knee: Secondary | ICD-10-CM | POA: Diagnosis not present

## 2016-07-03 DIAGNOSIS — M1711 Unilateral primary osteoarthritis, right knee: Secondary | ICD-10-CM | POA: Diagnosis not present

## 2016-07-09 DIAGNOSIS — M1711 Unilateral primary osteoarthritis, right knee: Secondary | ICD-10-CM | POA: Diagnosis not present

## 2016-07-16 DIAGNOSIS — M1711 Unilateral primary osteoarthritis, right knee: Secondary | ICD-10-CM | POA: Diagnosis not present

## 2016-07-19 IMAGING — CT CT L SPINE W/O CM
4 of 9 series · 13 of 33 positions shown, 15 images · non-contrast
Comparison: None.

CLINICAL DATA: Low back pain since falling 2 weeks ago. Evaluate
compression fracture seen on office radiographs. History of known
compression fracture.

EXAM:
CT LUMBAR SPINE WITHOUT CONTRAST
TECHNIQUE: Multidetector CT imaging of the lumbar spine was performed without
intravenous contrast administration. Multiplanar CT image
reconstructions were also generated.

[Series 4: l spine bone · axial · 0.31mm/px · z∈[-35,+42]mm · 2 of 94 slices shown]
[im 32/94  bone]
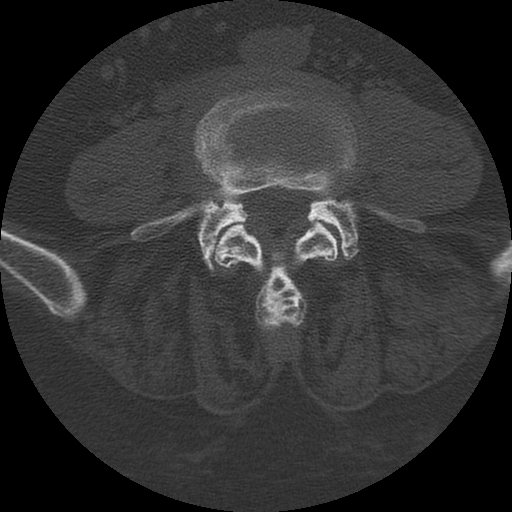
[im 63/94  bone]
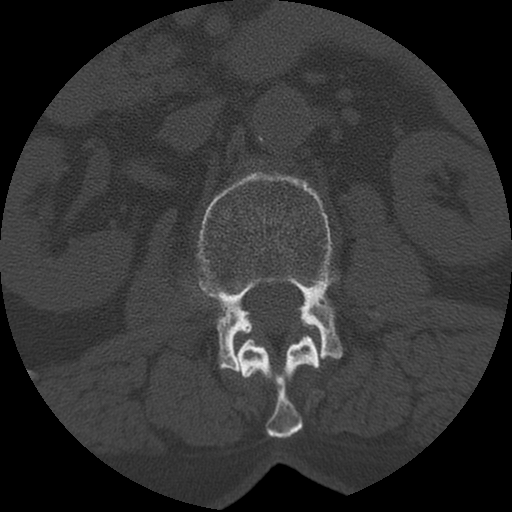

[Series 5: l spine detail · axial · 0.31mm/px · z∈[-55,+60]mm · 3 of 94 slices shown, 4 images]
[im 24/94  soft-tissue]
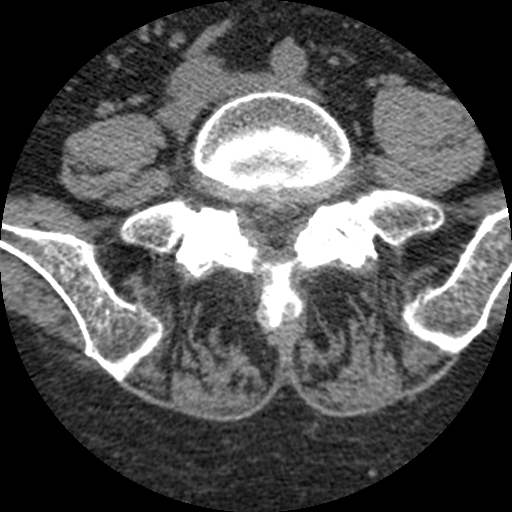
[im 24/94  bone]
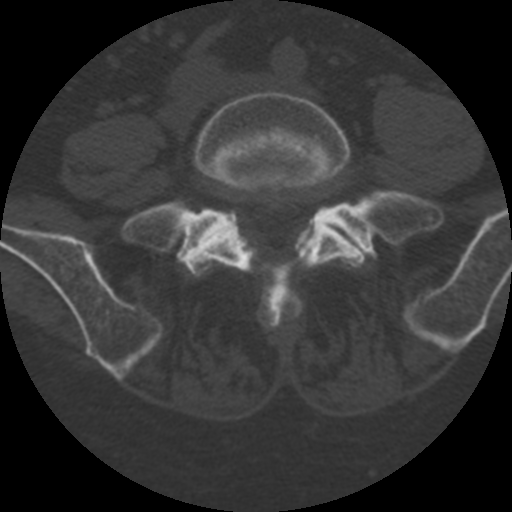
[im 47/94  bone]
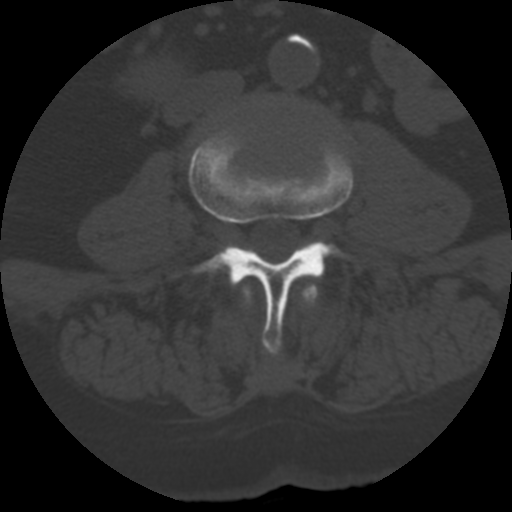
[im 70/94  bone]
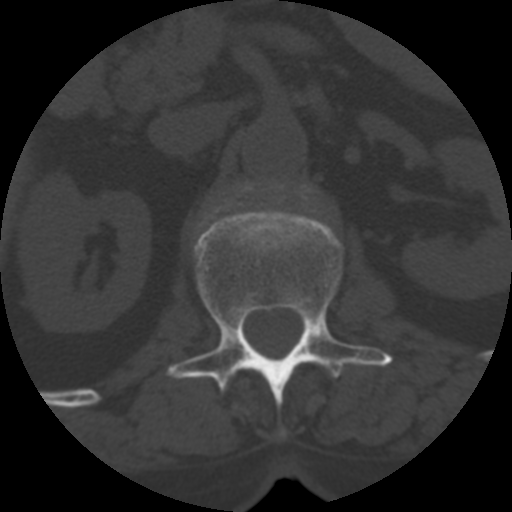

[Series 200: coronal · coronal · 0.47mm/px · 3 of 64 slices shown]
[im 13/64  bone]
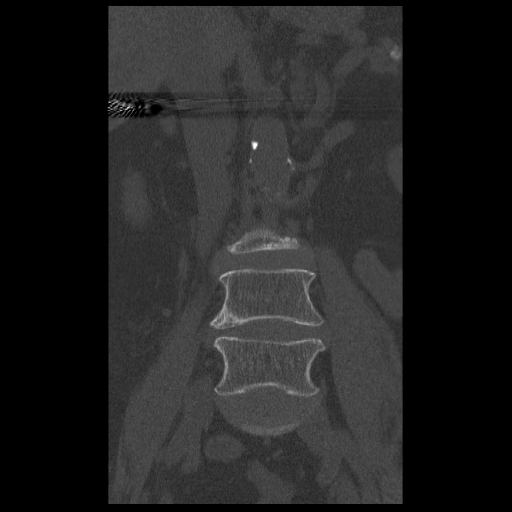
[im 26/64  bone]
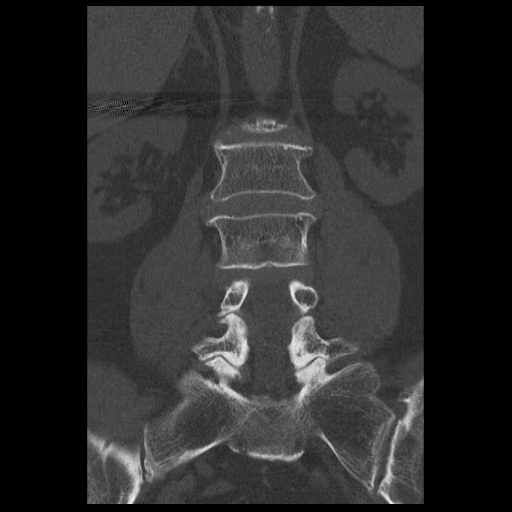
[im 38/64  bone]
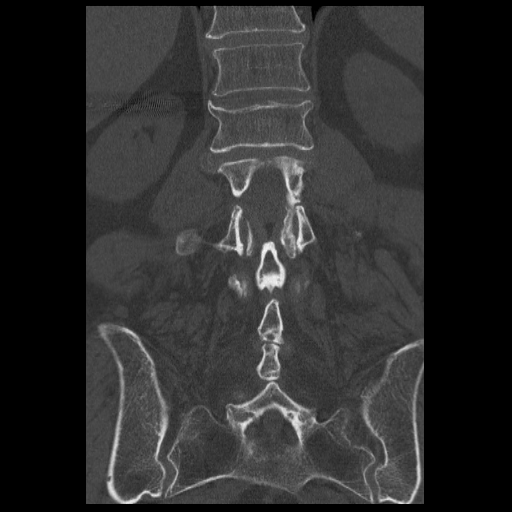

[Series 201: sagittal · sagittal · 0.47mm/px · 5 of 64 slices shown, 6 images]
[im 22/64  bone]
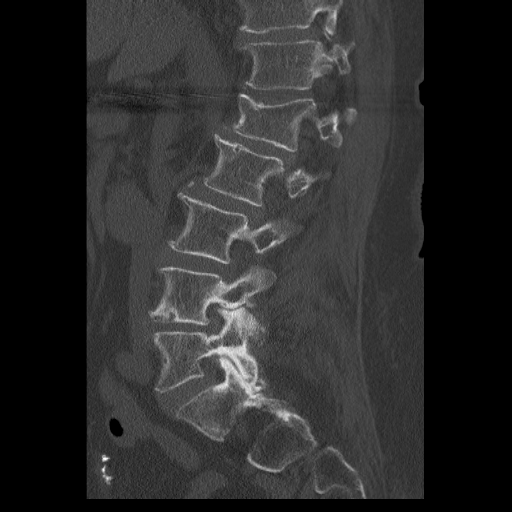
[im 27/64  bone]
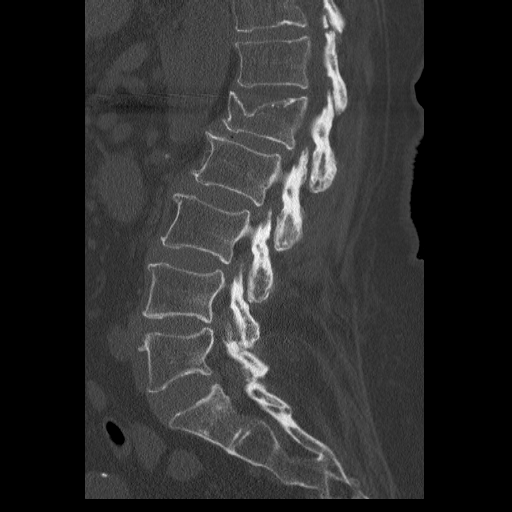
[im 32/64  soft-tissue]
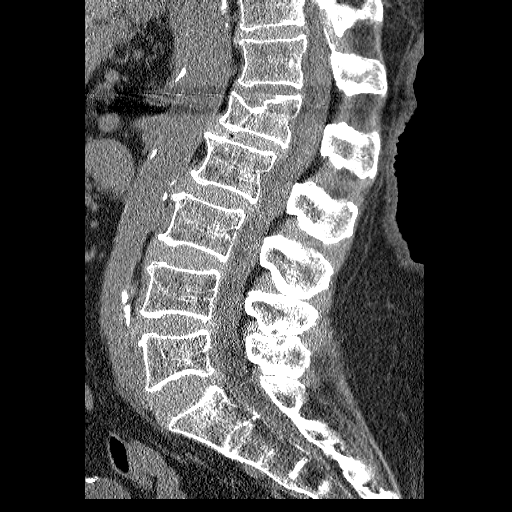
[im 32/64  bone]
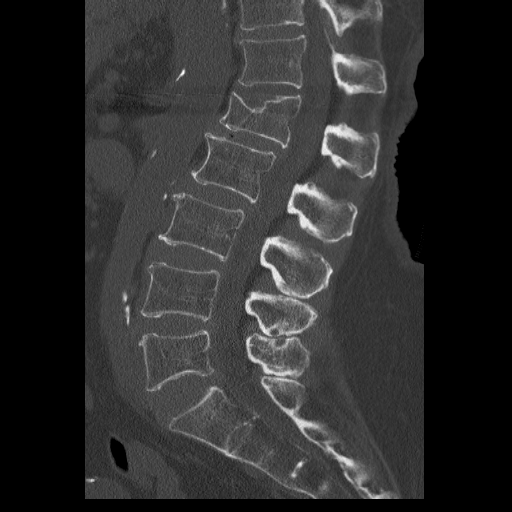
[im 37/64  bone]
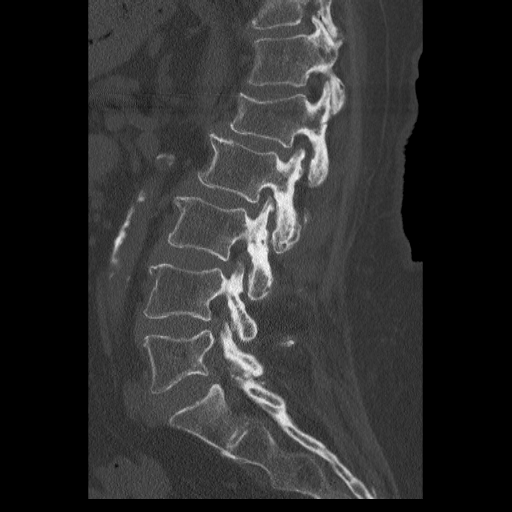
[im 43/64  bone]
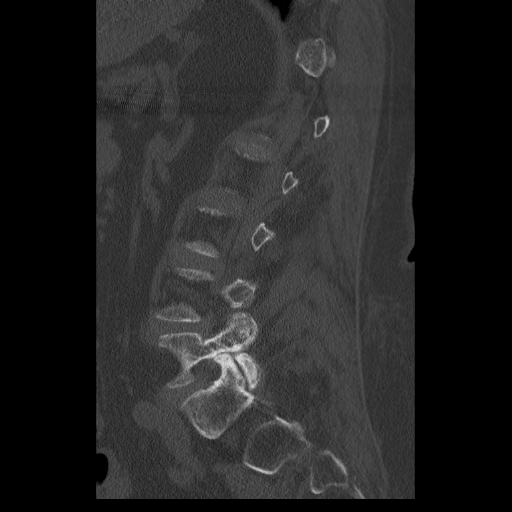

[13 of 33 positions shown; findings below may reference images not displayed]

FINDINGS: There are 5 lumbar type vertebral bodies. The alignment is near
anatomic. There is chronic appearing superior endplate compression
deformity at L1. No evidence of osseous retropulsion, acute
fracture, traumatic subluxation or paraspinal hematoma.

Aortoiliac atherosclerosis noted. There are small splenic artery
aneurysms. There are apparent bowel anastomosis clips in the false
pelvis.

L1-2: Annular disc bulging with mild loss of disc height. Mild facet
and ligamentous hypertrophy. No significant spinal stenosis or nerve
root encroachment.

L2-3: Mild multifactorial spinal stenosis secondary to annular disc
bulging, facet and ligamentous hypertrophy. Both foramina appear
mildly narrowed.

L3-4: Mild multifactorial spinal stenosis secondary to annular disc
bulging, facet and ligamentous hypertrophy. The disc bulging is
eccentric to the right, contributing to right foraminal narrowing
and potential right L3 nerve root encroachment.

L4-5: Mild to moderate multifactorial spinal stenosis secondary
annular disc bulging eccentric to the right and moderate facet and
ligamentous hypertrophy. There is moderate right foraminal narrowing
and potential right L4 nerve encroachment.

L5-S1: Relatively preserved disc height. Mild facet and ligamentous
hypertrophy. No significant spinal stenosis or nerve root
encroachment.
IMPRESSION: 1. Superior endplate compression deformity at L1 appears chronic. No
evidence of acute fracture or traumatic subluxation.
2. Disc bulging, facet and ligamentous hypertrophy contribute to
multifactorial spinal stenosis as detailed above, greatest at L4-5
where it appears mild to moderate. At L3-4 and L4-5, the disc
bulging is eccentric to the right and may contribute to
foraminal/extraforaminal nerve root encroachment.
3. Aortoiliac atherosclerosis.

## 2016-07-23 DIAGNOSIS — M1711 Unilateral primary osteoarthritis, right knee: Secondary | ICD-10-CM | POA: Diagnosis not present

## 2016-07-31 DIAGNOSIS — M1711 Unilateral primary osteoarthritis, right knee: Secondary | ICD-10-CM | POA: Diagnosis not present

## 2016-08-06 DIAGNOSIS — M1711 Unilateral primary osteoarthritis, right knee: Secondary | ICD-10-CM | POA: Diagnosis not present

## 2016-09-29 DIAGNOSIS — M5136 Other intervertebral disc degeneration, lumbar region: Secondary | ICD-10-CM | POA: Diagnosis not present

## 2016-12-11 DIAGNOSIS — K068 Other specified disorders of gingiva and edentulous alveolar ridge: Secondary | ICD-10-CM | POA: Diagnosis not present

## 2016-12-15 DIAGNOSIS — B0229 Other postherpetic nervous system involvement: Secondary | ICD-10-CM | POA: Diagnosis not present

## 2016-12-15 DIAGNOSIS — R21 Rash and other nonspecific skin eruption: Secondary | ICD-10-CM | POA: Diagnosis not present

## 2016-12-15 DIAGNOSIS — Z8619 Personal history of other infectious and parasitic diseases: Secondary | ICD-10-CM | POA: Diagnosis not present

## 2016-12-29 DIAGNOSIS — B0229 Other postherpetic nervous system involvement: Secondary | ICD-10-CM | POA: Diagnosis not present

## 2016-12-29 DIAGNOSIS — R21 Rash and other nonspecific skin eruption: Secondary | ICD-10-CM | POA: Diagnosis not present

## 2017-03-16 DIAGNOSIS — M810 Age-related osteoporosis without current pathological fracture: Secondary | ICD-10-CM | POA: Diagnosis not present

## 2017-03-16 DIAGNOSIS — Z23 Encounter for immunization: Secondary | ICD-10-CM | POA: Diagnosis not present

## 2017-03-16 DIAGNOSIS — Z0000 Encounter for general adult medical examination without abnormal findings: Secondary | ICD-10-CM | POA: Diagnosis not present

## 2017-03-16 DIAGNOSIS — R030 Elevated blood-pressure reading, without diagnosis of hypertension: Secondary | ICD-10-CM | POA: Diagnosis not present

## 2017-03-16 DIAGNOSIS — Z1389 Encounter for screening for other disorder: Secondary | ICD-10-CM | POA: Diagnosis not present

## 2017-03-16 DIAGNOSIS — K219 Gastro-esophageal reflux disease without esophagitis: Secondary | ICD-10-CM | POA: Diagnosis not present

## 2017-04-26 DIAGNOSIS — Z1231 Encounter for screening mammogram for malignant neoplasm of breast: Secondary | ICD-10-CM | POA: Diagnosis not present

## 2017-04-26 DIAGNOSIS — Z803 Family history of malignant neoplasm of breast: Secondary | ICD-10-CM | POA: Diagnosis not present

## 2017-04-30 DIAGNOSIS — K219 Gastro-esophageal reflux disease without esophagitis: Secondary | ICD-10-CM | POA: Diagnosis not present

## 2017-04-30 DIAGNOSIS — M810 Age-related osteoporosis without current pathological fracture: Secondary | ICD-10-CM | POA: Diagnosis not present

## 2017-04-30 DIAGNOSIS — I10 Essential (primary) hypertension: Secondary | ICD-10-CM | POA: Diagnosis not present

## 2017-06-08 DIAGNOSIS — I10 Essential (primary) hypertension: Secondary | ICD-10-CM | POA: Diagnosis not present

## 2017-07-23 DIAGNOSIS — J069 Acute upper respiratory infection, unspecified: Secondary | ICD-10-CM | POA: Diagnosis not present

## 2018-03-18 DIAGNOSIS — I10 Essential (primary) hypertension: Secondary | ICD-10-CM | POA: Diagnosis not present

## 2018-03-18 DIAGNOSIS — M810 Age-related osteoporosis without current pathological fracture: Secondary | ICD-10-CM | POA: Diagnosis not present

## 2018-03-18 DIAGNOSIS — Z1389 Encounter for screening for other disorder: Secondary | ICD-10-CM | POA: Diagnosis not present

## 2018-03-18 DIAGNOSIS — Z0000 Encounter for general adult medical examination without abnormal findings: Secondary | ICD-10-CM | POA: Diagnosis not present

## 2018-03-18 DIAGNOSIS — Z23 Encounter for immunization: Secondary | ICD-10-CM | POA: Diagnosis not present

## 2018-03-18 DIAGNOSIS — K219 Gastro-esophageal reflux disease without esophagitis: Secondary | ICD-10-CM | POA: Diagnosis not present

## 2018-04-27 DIAGNOSIS — M8589 Other specified disorders of bone density and structure, multiple sites: Secondary | ICD-10-CM | POA: Diagnosis not present

## 2018-04-27 DIAGNOSIS — Z803 Family history of malignant neoplasm of breast: Secondary | ICD-10-CM | POA: Diagnosis not present

## 2018-04-27 DIAGNOSIS — Z8262 Family history of osteoporosis: Secondary | ICD-10-CM | POA: Diagnosis not present

## 2018-04-27 DIAGNOSIS — Z96651 Presence of right artificial knee joint: Secondary | ICD-10-CM | POA: Diagnosis not present

## 2018-04-27 DIAGNOSIS — Z1231 Encounter for screening mammogram for malignant neoplasm of breast: Secondary | ICD-10-CM | POA: Diagnosis not present

## 2018-06-08 DIAGNOSIS — J069 Acute upper respiratory infection, unspecified: Secondary | ICD-10-CM | POA: Diagnosis not present

## 2018-06-23 ENCOUNTER — Other Ambulatory Visit: Payer: Self-pay | Admitting: Internal Medicine

## 2018-06-23 ENCOUNTER — Ambulatory Visit
Admission: RE | Admit: 2018-06-23 | Discharge: 2018-06-23 | Disposition: A | Discharge status: Home / Self Care | Payer: MEDICARE | Source: Ambulatory Visit | Attending: Internal Medicine | Admitting: Internal Medicine

## 2018-06-23 DIAGNOSIS — R059 Cough, unspecified: Secondary | ICD-10-CM

## 2018-06-23 DIAGNOSIS — R05 Cough: Secondary | ICD-10-CM

## 2018-06-23 DIAGNOSIS — J0100 Acute maxillary sinusitis, unspecified: Secondary | ICD-10-CM | POA: Diagnosis not present

## 2018-09-19 DIAGNOSIS — I10 Essential (primary) hypertension: Secondary | ICD-10-CM | POA: Diagnosis not present

## 2018-09-19 DIAGNOSIS — K219 Gastro-esophageal reflux disease without esophagitis: Secondary | ICD-10-CM | POA: Diagnosis not present

## 2018-09-19 DIAGNOSIS — M810 Age-related osteoporosis without current pathological fracture: Secondary | ICD-10-CM | POA: Diagnosis not present

## 2019-02-12 ENCOUNTER — Ambulatory Visit (INDEPENDENT_AMBULATORY_CARE_PROVIDER_SITE_OTHER): Payer: MEDICARE

## 2019-02-12 ENCOUNTER — Ambulatory Visit (HOSPITAL_COMMUNITY)
Admission: EM | Admit: 2019-02-12 | Discharge: 2019-02-12 | Disposition: A | Discharge status: Home / Self Care | Payer: MEDICARE | Attending: Family Medicine | Admitting: Family Medicine

## 2019-02-12 ENCOUNTER — Encounter (HOSPITAL_COMMUNITY): Payer: Self-pay | Admitting: Emergency Medicine

## 2019-02-12 ENCOUNTER — Other Ambulatory Visit: Payer: Self-pay

## 2019-02-12 DIAGNOSIS — U071 COVID-19: Secondary | ICD-10-CM | POA: Diagnosis not present

## 2019-02-12 DIAGNOSIS — Z7982 Long term (current) use of aspirin: Secondary | ICD-10-CM | POA: Insufficient documentation

## 2019-02-12 DIAGNOSIS — Z803 Family history of malignant neoplasm of breast: Secondary | ICD-10-CM | POA: Diagnosis not present

## 2019-02-12 DIAGNOSIS — R509 Fever, unspecified: Secondary | ICD-10-CM | POA: Diagnosis not present

## 2019-02-12 DIAGNOSIS — Z882 Allergy status to sulfonamides status: Secondary | ICD-10-CM | POA: Diagnosis not present

## 2019-02-12 DIAGNOSIS — Z9049 Acquired absence of other specified parts of digestive tract: Secondary | ICD-10-CM | POA: Diagnosis not present

## 2019-02-12 DIAGNOSIS — H269 Unspecified cataract: Secondary | ICD-10-CM | POA: Insufficient documentation

## 2019-02-12 DIAGNOSIS — Z20828 Contact with and (suspected) exposure to other viral communicable diseases: Secondary | ICD-10-CM

## 2019-02-12 DIAGNOSIS — R1110 Vomiting, unspecified: Secondary | ICD-10-CM

## 2019-02-12 DIAGNOSIS — B349 Viral infection, unspecified: Secondary | ICD-10-CM | POA: Diagnosis not present

## 2019-02-12 DIAGNOSIS — R05 Cough: Secondary | ICD-10-CM | POA: Diagnosis not present

## 2019-02-12 DIAGNOSIS — M8580 Other specified disorders of bone density and structure, unspecified site: Secondary | ICD-10-CM | POA: Insufficient documentation

## 2019-02-12 DIAGNOSIS — R197 Diarrhea, unspecified: Secondary | ICD-10-CM | POA: Diagnosis not present

## 2019-02-12 MED ORDER — ONDANSETRON 4 MG PO TBDP
4.0000 mg | ORAL_TABLET | Freq: Three times a day (TID) | ORAL | 0 refills | Status: AC | PRN
Start: 2019-02-12 — End: ?

## 2019-02-12 NOTE — Discharge Instructions (Addendum)
Your x-ray was normal.  No concerns for pneumonia or bronchitis at this time. I am any give you some nausea medicine to use as needed for nausea, vomiting Make sure you are sipping fluids to stay hydrated Small meals that do not upset your stomach.  Bland foods include toast, applesauce, bananas We should have your COVID results in a couple of days. You can continue using the Mucinex for cough and to thin mucous.  Tylenol for pain .  If symptoms worsen or do not improve please follow up with your doctor.

## 2019-02-12 NOTE — ED Triage Notes (Signed)
Pt here with multiple complaints over last several days including chills, cough, diarrhea and vomiting

## 2019-02-12 NOTE — ED Provider Notes (Signed)
[facility]    CSN: [id number] Arrival date & time: 02/12/19  1230      History   Chief Complaint Chief Complaint  Patient presents with  . Diarrhea  . Chills    HPI [name] is a 78 y.o. female.   Patient is a 78 year old female past medical history of allergy, cataract, GERD, osteopenia, gastroenteritis, IBS.  She presents today with diarrhea, vomiting, chills, cough since 3 days ago.  Started with one episode of diarrhea after eating toast.  And she began to have body aches and chills.  The second day she ate applesauce and was able to hold that down.  She then ate toast again and vomited twice.  She developed a cough overnight.  Describes as nonproductive but pretty consistent.  She did not take her temperature at home but has felt feverish.  No sore throat, ear pain.  She feels very weak and her husband had to help her stand to walk.  She has been sipping ginger ale.  She lives with her husband and daughter.  Reports that they have been taking extreme precautions for COVID and she has not left her house since March 1.  ROS per HPI      Past Medical History:  Diagnosis Date  . Allergy   . Cataract   . Congenital defect    left arm  . GERD (gastroesophageal reflux disease)   . IBS (irritable bowel syndrome)   . Osteopenia   . 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 Unspecified vitamin D deficiency 2010    Patient Active Problem List   Diagnosis Date Noted  . Unspecified vitamin D deficiency 10/10/2010  . Osteopenia   . Gastroenteritis 09/13/2010  . UTI (lower urinary tract infection) 09/13/2010    Past Surgical History:  Procedure Laterality Date  . CATARACT EXTRACTION     both eyes  . CHOLECYSTECTOMY    . 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 Maureen Ralphs)    OB History    Gravida  8   Para  8   Term      Preterm  1   AB      Living  6     SAB      TAB      Ectopic      Multiple      Live Births           Obstetric Comments  1 was pre-term and lived <2 hrs; another child died at 70 months, 6 days (lungs didn't develop properly)         Home Medications    Prior to Admission medications   Medication Sig Start Date End Date Taking? Authorizing Provider  aspirin 81 MG EC tablet Take 81 mg by mouth every morning.     [provider]  ondansetron (ZOFRAN ODT) 4 MG disintegrating tablet Take 1 tablet (4 mg total) by mouth every 8 (eight) hours as needed for nausea or vomiting. 02/12/19   Orvan July, NP    Family History Family History  Problem Relation Age of Onset  . Stroke Mother   . Vision loss Mother   . Vision loss Father   . Stroke Father   . Cancer Sister 23       breast cancer  . Vision loss Brother   . Diabetes Neg Hx   . Heart disease  Neg Hx     Social History Social History   Tobacco Use  . Smoking status: Never Smoker  . Smokeless tobacco: Never Used  Substance Use Topics  . Alcohol use: No  . Drug use: No     Allergies   Sulfa drugs cross reactors   Review of Systems Review of Systems   Physical Exam Triage Vital Signs ED Triage Vitals [02/12/19 1257]  Enc Vitals Group     BP (!) 166/66     Pulse Rate 76     Resp 18     Temp 98.2 F (36.8 C)     Temp Source Oral     SpO2 98 %     Weight      Height      Head Circumference      Peak Flow      Pain Score 3     Pain Loc      Pain Edu?      Excl. in GC?    No data found.  Updated Vital Signs BP (!) 166/66 (BP Location: Right Arm)   Pulse 76   Temp 98.2 F (36.8 C) (Oral)   Resp 18   SpO2 98%   Visual Acuity Right Eye Distance:   Left Eye Distance:   Bilateral Distance:    Right Eye Near:   Left Eye Near:    Bilateral Near:     Physical Exam Vitals signs and nursing note reviewed.  Constitutional:      General: She is not in acute distress.    Appearance: She is not ill-appearing, toxic-appearing  or diaphoretic.  HENT:     Head: Normocephalic and atraumatic.     Nose: Nose normal.     Mouth/Throat:     Mouth: Mucous membranes are dry.  Eyes:     Conjunctiva/sclera: Conjunctivae normal.  Neck:     Musculoskeletal: Normal range of motion.  Cardiovascular:     Rate and Rhythm: Normal rate and regular rhythm.     Pulses: Normal pulses.     Heart sounds: Normal heart sounds.  Pulmonary:     Effort: Pulmonary effort is normal.     Breath sounds: Rhonchi present.  Musculoskeletal: Normal range of motion.  Skin:    General: Skin is warm and dry.  Neurological:     Mental Status: She is alert.  Psychiatric:        Mood and Affect: Mood normal.      UC Treatments / Results  Labs (all labs ordered are listed, but only abnormal results are displayed) Labs Reviewed  NOVEL CORONAVIRUS, NAA (HOSP ORDER, SEND-OUT TO REF LAB; TAT 18-24 HRS)    EKG   Radiology Dg Chest 2 View  Result Date: 02/12/2019 CLINICAL DATA:  Cough, fever, chills EXAM: CHEST - 2 VIEW COMPARISON:  06/23/2018 FINDINGS: The heart size and mediastinal contours are within normal limits. Both lungs are clear. There is a redemonstrated wedge deformity of approximately T12 with focal kyphosis. IMPRESSION: No acute abnormality of the lungs. Electronically Signed   By: Lauralyn Primes M.D.   On: 02/12/2019 14:05    Procedures Procedures (including critical care time)  Medications Ordered in UC Medications - No data to display  Initial Impression / Assessment and Plan / UC Course  I have reviewed the triage vital signs and the nursing notes.  Pertinent labs & imaging results that were available during my care of the patient were reviewed by me and considered in my  medical decision making (see chart for details).     [age] year old female presenting with viral type symptoms. VSS and she is non toxic or ill appearing.  No abdominal tenderness.  Some rhonchi heard on exam in the upper airways.  Chest x-ray  normal Most likely some congestion in upper airway.  We will have her continue the Mucinex and sip fluids. Zofran as needed for nausea, vomiting Rest  COVID test pending with precautions given. Recommended follow-up with her doctor next week if symptoms do not improve or worsen. Pt ambulated to the door on exit without any problems and did not get dizzy or light headed upon standing.  Final Clinical Impressions(s) / UC Diagnoses   Final diagnoses:  Viral illness     Discharge Instructions     Your x-ray was normal.  No concerns for pneumonia or bronchitis at this time. I am any give you some nausea medicine to use as needed for nausea, vomiting Make sure you are sipping fluids to stay hydrated Small meals that do not upset your stomach.  Bland foods include toast, applesauce, bananas We should have your COVID results in a couple of days. You can continue using the Mucinex for cough and to thin mucous.  Tylenol for pain .  If symptoms worsen or do not improve please follow up with your doctor.     ED Prescriptions    Medication Sig Dispense Auth. Provider   ondansetron (ZOFRAN ODT) 4 MG disintegrating tablet Take 1 tablet (4 mg total) by mouth every 8 (eight) hours as needed for nausea or vomiting. 20 tablet Dahlia ByesBast, Odes Lolli A, NP     PDMP not reviewed this encounter.   Janace ArisBast, Dakwan Pridgen A, NP 02/12/19 1436

## 2019-02-14 LAB — NOVEL CORONAVIRUS, NAA (HOSP ORDER, SEND-OUT TO REF LAB; TAT 18-24 HRS): SARS-CoV-2, NAA: DETECTED — AB

## 2019-02-15 ENCOUNTER — Telehealth (HOSPITAL_COMMUNITY): Payer: Self-pay | Admitting: Emergency Medicine

## 2019-02-15 NOTE — Telephone Encounter (Signed)
Positive covid, pt was contacted and made aware, given quarantine info, etc. All questions answered.

## 2019-03-31 DIAGNOSIS — Z23 Encounter for immunization: Secondary | ICD-10-CM | POA: Diagnosis not present

## 2019-03-31 DIAGNOSIS — Z0000 Encounter for general adult medical examination without abnormal findings: Secondary | ICD-10-CM | POA: Diagnosis not present

## 2019-03-31 DIAGNOSIS — I10 Essential (primary) hypertension: Secondary | ICD-10-CM | POA: Diagnosis not present

## 2019-03-31 DIAGNOSIS — K219 Gastro-esophageal reflux disease without esophagitis: Secondary | ICD-10-CM | POA: Diagnosis not present

## 2019-03-31 DIAGNOSIS — M810 Age-related osteoporosis without current pathological fracture: Secondary | ICD-10-CM | POA: Diagnosis not present

## 2019-03-31 DIAGNOSIS — N1831 Chronic kidney disease, stage 3a: Secondary | ICD-10-CM | POA: Diagnosis not present

## 2019-03-31 DIAGNOSIS — Z1389 Encounter for screening for other disorder: Secondary | ICD-10-CM | POA: Diagnosis not present

## 2019-04-10 DIAGNOSIS — M1711 Unilateral primary osteoarthritis, right knee: Secondary | ICD-10-CM | POA: Insufficient documentation

## 2019-05-01 DIAGNOSIS — Z1231 Encounter for screening mammogram for malignant neoplasm of breast: Secondary | ICD-10-CM | POA: Diagnosis not present

## 2019-05-01 DIAGNOSIS — Z803 Family history of malignant neoplasm of breast: Secondary | ICD-10-CM | POA: Diagnosis not present

## 2019-05-09 DIAGNOSIS — M1711 Unilateral primary osteoarthritis, right knee: Secondary | ICD-10-CM | POA: Diagnosis not present

## 2019-05-09 DIAGNOSIS — M25561 Pain in right knee: Secondary | ICD-10-CM | POA: Diagnosis not present

## 2019-05-22 DIAGNOSIS — M79671 Pain in right foot: Secondary | ICD-10-CM | POA: Insufficient documentation

## 2019-06-13 DIAGNOSIS — M79671 Pain in right foot: Secondary | ICD-10-CM | POA: Diagnosis not present

## 2019-06-20 ENCOUNTER — Emergency Department (HOSPITAL_COMMUNITY): Payer: MEDICARE

## 2019-06-20 ENCOUNTER — Inpatient Hospital Stay (HOSPITAL_COMMUNITY)
Admission: EM | Admit: 2019-06-20 | Discharge: 2019-06-23 | DRG: 522 | Disposition: A | Discharge status: Skilled Nursing Facility | Payer: MEDICARE | Attending: Family Medicine | Admitting: Family Medicine

## 2019-06-20 ENCOUNTER — Encounter (HOSPITAL_COMMUNITY): Payer: Self-pay | Admitting: Internal Medicine

## 2019-06-20 ENCOUNTER — Inpatient Hospital Stay (HOSPITAL_COMMUNITY): Payer: MEDICARE

## 2019-06-20 ENCOUNTER — Inpatient Hospital Stay (HOSPITAL_COMMUNITY): Payer: MEDICARE | Admitting: Anesthesiology

## 2019-06-20 ENCOUNTER — Encounter (HOSPITAL_COMMUNITY)
Admission: EM | Disposition: A | Discharge status: Skilled Nursing Facility | Payer: Self-pay | Source: Home / Self Care | Attending: Family Medicine

## 2019-06-20 ENCOUNTER — Other Ambulatory Visit: Payer: Self-pay

## 2019-06-20 DIAGNOSIS — S72012A Unspecified intracapsular fracture of left femur, initial encounter for closed fracture: Principal | ICD-10-CM | POA: Diagnosis present

## 2019-06-20 DIAGNOSIS — Z20822 Contact with and (suspected) exposure to covid-19: Secondary | ICD-10-CM | POA: Diagnosis not present

## 2019-06-20 DIAGNOSIS — Z7982 Long term (current) use of aspirin: Secondary | ICD-10-CM

## 2019-06-20 DIAGNOSIS — S8992XA Unspecified injury of left lower leg, initial encounter: Secondary | ICD-10-CM | POA: Diagnosis not present

## 2019-06-20 DIAGNOSIS — S72009A Fracture of unspecified part of neck of unspecified femur, initial encounter for closed fracture: Secondary | ICD-10-CM | POA: Insufficient documentation

## 2019-06-20 DIAGNOSIS — D62 Acute posthemorrhagic anemia: Secondary | ICD-10-CM | POA: Diagnosis not present

## 2019-06-20 DIAGNOSIS — R488 Other symbolic dysfunctions: Secondary | ICD-10-CM | POA: Diagnosis not present

## 2019-06-20 DIAGNOSIS — M25462 Effusion, left knee: Secondary | ICD-10-CM

## 2019-06-20 DIAGNOSIS — Z471 Aftercare following joint replacement surgery: Secondary | ICD-10-CM | POA: Diagnosis not present

## 2019-06-20 DIAGNOSIS — I10 Essential (primary) hypertension: Secondary | ICD-10-CM | POA: Diagnosis present

## 2019-06-20 DIAGNOSIS — Z9842 Cataract extraction status, left eye: Secondary | ICD-10-CM

## 2019-06-20 DIAGNOSIS — Z96642 Presence of left artificial hip joint: Secondary | ICD-10-CM | POA: Diagnosis not present

## 2019-06-20 DIAGNOSIS — Z9049 Acquired absence of other specified parts of digestive tract: Secondary | ICD-10-CM

## 2019-06-20 DIAGNOSIS — Y92009 Unspecified place in unspecified non-institutional (private) residence as the place of occurrence of the external cause: Secondary | ICD-10-CM | POA: Diagnosis not present

## 2019-06-20 DIAGNOSIS — R9431 Abnormal electrocardiogram [ECG] [EKG]: Secondary | ICD-10-CM | POA: Diagnosis not present

## 2019-06-20 DIAGNOSIS — W19XXXD Unspecified fall, subsequent encounter: Secondary | ICD-10-CM | POA: Diagnosis not present

## 2019-06-20 DIAGNOSIS — Z823 Family history of stroke: Secondary | ICD-10-CM

## 2019-06-20 DIAGNOSIS — K219 Gastro-esophageal reflux disease without esophagitis: Secondary | ICD-10-CM | POA: Diagnosis not present

## 2019-06-20 DIAGNOSIS — Z9181 History of falling: Secondary | ICD-10-CM

## 2019-06-20 DIAGNOSIS — M25552 Pain in left hip: Secondary | ICD-10-CM | POA: Diagnosis not present

## 2019-06-20 DIAGNOSIS — N179 Acute kidney failure, unspecified: Secondary | ICD-10-CM | POA: Diagnosis not present

## 2019-06-20 DIAGNOSIS — Z419 Encounter for procedure for purposes other than remedying health state, unspecified: Secondary | ICD-10-CM

## 2019-06-20 DIAGNOSIS — Z79899 Other long term (current) drug therapy: Secondary | ICD-10-CM | POA: Diagnosis not present

## 2019-06-20 DIAGNOSIS — H259 Unspecified age-related cataract: Secondary | ICD-10-CM | POA: Diagnosis not present

## 2019-06-20 DIAGNOSIS — R262 Difficulty in walking, not elsewhere classified: Secondary | ICD-10-CM | POA: Diagnosis not present

## 2019-06-20 DIAGNOSIS — Z803 Family history of malignant neoplasm of breast: Secondary | ICD-10-CM | POA: Diagnosis not present

## 2019-06-20 DIAGNOSIS — Z9841 Cataract extraction status, right eye: Secondary | ICD-10-CM | POA: Diagnosis not present

## 2019-06-20 DIAGNOSIS — Z7983 Long term (current) use of bisphosphonates: Secondary | ICD-10-CM | POA: Diagnosis not present

## 2019-06-20 DIAGNOSIS — Y9301 Activity, walking, marching and hiking: Secondary | ICD-10-CM | POA: Diagnosis present

## 2019-06-20 DIAGNOSIS — Z821 Family history of blindness and visual loss: Secondary | ICD-10-CM | POA: Diagnosis not present

## 2019-06-20 DIAGNOSIS — S72002A Fracture of unspecified part of neck of left femur, initial encounter for closed fracture: Secondary | ICD-10-CM | POA: Diagnosis not present

## 2019-06-20 DIAGNOSIS — M2550 Pain in unspecified joint: Secondary | ICD-10-CM | POA: Diagnosis not present

## 2019-06-20 DIAGNOSIS — Z882 Allergy status to sulfonamides status: Secondary | ICD-10-CM | POA: Diagnosis not present

## 2019-06-20 DIAGNOSIS — K5900 Constipation, unspecified: Secondary | ICD-10-CM | POA: Diagnosis not present

## 2019-06-20 DIAGNOSIS — Z8616 Personal history of COVID-19: Secondary | ICD-10-CM | POA: Diagnosis not present

## 2019-06-20 DIAGNOSIS — D696 Thrombocytopenia, unspecified: Secondary | ICD-10-CM | POA: Diagnosis present

## 2019-06-20 DIAGNOSIS — R52 Pain, unspecified: Secondary | ICD-10-CM | POA: Diagnosis not present

## 2019-06-20 DIAGNOSIS — Z03818 Encounter for observation for suspected exposure to other biological agents ruled out: Secondary | ICD-10-CM | POA: Diagnosis not present

## 2019-06-20 DIAGNOSIS — S72009D Fracture of unspecified part of neck of unspecified femur, subsequent encounter for closed fracture with routine healing: Secondary | ICD-10-CM | POA: Diagnosis not present

## 2019-06-20 DIAGNOSIS — M6281 Muscle weakness (generalized): Secondary | ICD-10-CM | POA: Diagnosis not present

## 2019-06-20 DIAGNOSIS — Z4789 Encounter for other orthopedic aftercare: Secondary | ICD-10-CM | POA: Diagnosis not present

## 2019-06-20 DIAGNOSIS — M25562 Pain in left knee: Secondary | ICD-10-CM | POA: Diagnosis not present

## 2019-06-20 DIAGNOSIS — S79912A Unspecified injury of left hip, initial encounter: Secondary | ICD-10-CM | POA: Diagnosis not present

## 2019-06-20 DIAGNOSIS — M8580 Other specified disorders of bone density and structure, unspecified site: Secondary | ICD-10-CM | POA: Diagnosis present

## 2019-06-20 DIAGNOSIS — I959 Hypotension, unspecified: Secondary | ICD-10-CM | POA: Diagnosis not present

## 2019-06-20 DIAGNOSIS — W1830XA Fall on same level, unspecified, initial encounter: Secondary | ICD-10-CM | POA: Diagnosis present

## 2019-06-20 DIAGNOSIS — E559 Vitamin D deficiency, unspecified: Secondary | ICD-10-CM | POA: Diagnosis not present

## 2019-06-20 DIAGNOSIS — Z7401 Bed confinement status: Secondary | ICD-10-CM | POA: Diagnosis not present

## 2019-06-20 DIAGNOSIS — M7989 Other specified soft tissue disorders: Secondary | ICD-10-CM | POA: Diagnosis not present

## 2019-06-20 DIAGNOSIS — W19XXXA Unspecified fall, initial encounter: Secondary | ICD-10-CM

## 2019-06-20 DIAGNOSIS — Z96652 Presence of left artificial knee joint: Secondary | ICD-10-CM | POA: Diagnosis not present

## 2019-06-20 DIAGNOSIS — R0902 Hypoxemia: Secondary | ICD-10-CM | POA: Diagnosis not present

## 2019-06-20 DIAGNOSIS — G839 Paralytic syndrome, unspecified: Secondary | ICD-10-CM | POA: Diagnosis not present

## 2019-06-20 HISTORY — PX: TOTAL HIP ARTHROPLASTY: SHX124

## 2019-06-20 LAB — POCT I-STAT, CHEM 8
BUN: 16 mg/dL (ref 8–23)
Calcium, Ion: 1.19 mmol/L (ref 1.15–1.40)
Chloride: 105 mmol/L (ref 98–111)
Creatinine, Ser: 0.9 mg/dL (ref 0.44–1.00)
Glucose, Bld: 106 mg/dL — ABNORMAL HIGH (ref 70–99)
HCT: 29 % — ABNORMAL LOW (ref 36.0–46.0)
Hemoglobin: 9.9 g/dL — ABNORMAL LOW (ref 12.0–15.0)
Potassium: 4.2 mmol/L (ref 3.5–5.1)
Sodium: 140 mmol/L (ref 135–145)
TCO2: 24 mmol/L (ref 22–32)

## 2019-06-20 LAB — BASIC METABOLIC PANEL
Anion gap: 7 (ref 5–15)
BUN: 19 mg/dL (ref 8–23)
CO2: 26 mmol/L (ref 22–32)
Calcium: 8.6 mg/dL — ABNORMAL LOW (ref 8.9–10.3)
Chloride: 108 mmol/L (ref 98–111)
Creatinine, Ser: 1.04 mg/dL — ABNORMAL HIGH (ref 0.44–1.00)
GFR calc Af Amer: 58 mL/min — ABNORMAL LOW (ref >60)
GFR calc non Af Amer: 50 mL/min — ABNORMAL LOW (ref >60)
Glucose, Bld: 175 mg/dL — ABNORMAL HIGH (ref 70–99)
Potassium: 3.7 mmol/L (ref 3.5–5.1)
Sodium: 141 mmol/L (ref 135–145)

## 2019-06-20 LAB — CBC WITH DIFFERENTIAL/PLATELET
Abs Immature Granulocytes: 0.05 10*3/uL (ref 0.00–0.07)
Basophils Absolute: 0 10*3/uL (ref 0.0–0.1)
Basophils Relative: 0 %
Eosinophils Absolute: 0.2 10*3/uL (ref 0.0–0.5)
Eosinophils Relative: 2 %
HCT: 36.9 % (ref 36.0–46.0)
Hemoglobin: 12.3 g/dL (ref 12.0–15.0)
Immature Granulocytes: 1 %
Lymphocytes Relative: 15 %
Lymphs Abs: 1.5 10*3/uL (ref 0.7–4.0)
MCH: 31.3 pg (ref 26.0–34.0)
MCHC: 33.3 g/dL (ref 30.0–36.0)
MCV: 93.9 fL (ref 80.0–100.0)
Monocytes Absolute: 0.6 10*3/uL (ref 0.1–1.0)
Monocytes Relative: 6 %
Neutro Abs: 7.7 10*3/uL (ref 1.7–7.7)
Neutrophils Relative %: 76 %
Platelets: 156 10*3/uL (ref 150–400)
RBC: 3.93 MIL/uL (ref 3.87–5.11)
RDW: 12.6 % (ref 11.5–15.5)
WBC: 10.1 10*3/uL (ref 4.0–10.5)
nRBC: 0 % (ref 0.0–0.2)

## 2019-06-20 LAB — PROTIME-INR
INR: 1.2 (ref 0.8–1.2)
Prothrombin Time: 14.8 seconds (ref 11.4–15.2)

## 2019-06-20 LAB — TYPE AND SCREEN
ABO/RH(D): A NEG
Antibody Screen: NEGATIVE

## 2019-06-20 LAB — VITAMIN D 25 HYDROXY (VIT D DEFICIENCY, FRACTURES): Vit D, 25-Hydroxy: 20.7 ng/mL — ABNORMAL LOW (ref 30–100)

## 2019-06-20 LAB — TSH: TSH: 0.66 u[IU]/mL (ref 0.350–4.500)

## 2019-06-20 LAB — RESPIRATORY PANEL BY RT PCR (FLU A&B, COVID)
Influenza A by PCR: NEGATIVE
Influenza B by PCR: NEGATIVE
SARS Coronavirus 2 by RT PCR: NEGATIVE

## 2019-06-20 SURGERY — ARTHROPLASTY, HIP, TOTAL, ANTERIOR APPROACH
Anesthesia: Spinal | Site: Hip | Laterality: Left

## 2019-06-20 MED ORDER — ALBUMIN HUMAN 5 % IV SOLN
INTRAVENOUS | Status: AC
  Filled 2019-06-20: qty 250

## 2019-06-20 MED ORDER — MENTHOL 3 MG MT LOZG: 1.0000 | LOZENGE | OROMUCOSAL | Status: DC | PRN

## 2019-06-20 MED ORDER — PROPOFOL 500 MG/50ML IV EMUL
INTRAVENOUS | Status: AC
  Filled 2019-06-20: qty 100

## 2019-06-20 MED ORDER — ONDANSETRON HCL 4 MG/2ML IJ SOLN
4.0000 mg | Freq: Four times a day (QID) | INTRAMUSCULAR | Status: DC | PRN
  Administered 2019-06-21: 4 mg via INTRAVENOUS
  Filled 2019-06-20: qty 2

## 2019-06-20 MED ORDER — FENTANYL CITRATE (PF) 100 MCG/2ML IJ SOLN
50.0000 ug | Freq: Once | INTRAMUSCULAR | Status: AC
  Administered 2019-06-20: 13:00:00 50 ug via INTRAVENOUS
  Filled 2019-06-20: qty 2

## 2019-06-20 MED ORDER — POLYETHYLENE GLYCOL 3350 17 G PO PACK: 17.0000 g | PACK | Freq: Every day | ORAL | Status: DC | PRN

## 2019-06-20 MED ORDER — CELECOXIB 200 MG PO CAPS: 200.0000 mg | ORAL_CAPSULE | Freq: Once | ORAL | Status: DC

## 2019-06-20 MED ORDER — ACETAMINOPHEN 325 MG PO TABS: 650.0000 mg | ORAL_TABLET | Freq: Four times a day (QID) | ORAL | Status: DC | PRN

## 2019-06-20 MED ORDER — ONDANSETRON HCL 4 MG PO TABS: 4.0000 mg | ORAL_TABLET | Freq: Four times a day (QID) | ORAL | Status: DC | PRN

## 2019-06-20 MED ORDER — SODIUM CHLORIDE (PF) 0.9 % IJ SOLN
INTRAMUSCULAR | Status: DC | PRN
  Administered 2019-06-20: 30 mL

## 2019-06-20 MED ORDER — SODIUM CHLORIDE (PF) 0.9 % IJ SOLN
INTRAMUSCULAR | Status: AC
  Filled 2019-06-20: qty 50

## 2019-06-20 MED ORDER — EPHEDRINE 5 MG/ML INJ
INTRAVENOUS | Status: AC
  Filled 2019-06-20: qty 10

## 2019-06-20 MED ORDER — MORPHINE SULFATE (PF) 2 MG/ML IV SOLN: 0.5000 mg | INTRAVENOUS | Status: DC | PRN

## 2019-06-20 MED ORDER — DOCUSATE SODIUM 100 MG PO CAPS
100.0000 mg | ORAL_CAPSULE | Freq: Two times a day (BID) | ORAL | Status: DC
  Administered 2019-06-20 – 2019-06-23 (×6): 100 mg via ORAL
  Filled 2019-06-20 (×6): qty 1

## 2019-06-20 MED ORDER — PHENOL 1.4 % MT LIQD
1.0000 | OROMUCOSAL | Status: DC | PRN
  Filled 2019-06-20: qty 177

## 2019-06-20 MED ORDER — HYDROCODONE-ACETAMINOPHEN 7.5-325 MG PO TABS: 1.0000 | ORAL_TABLET | ORAL | Status: DC | PRN

## 2019-06-20 MED ORDER — PHENYLEPHRINE 40 MCG/ML (10ML) SYRINGE FOR IV PUSH (FOR BLOOD PRESSURE SUPPORT)
PREFILLED_SYRINGE | INTRAVENOUS | Status: AC
  Filled 2019-06-20: qty 10

## 2019-06-20 MED ORDER — ISOPROPYL ALCOHOL 70 % SOLN
Status: AC
  Filled 2019-06-20: qty 480

## 2019-06-20 MED ORDER — BUPIVACAINE HCL (PF) 0.25 % IJ SOLN
INTRAMUSCULAR | Status: AC
  Filled 2019-06-20: qty 30

## 2019-06-20 MED ORDER — PROPOFOL 500 MG/50ML IV EMUL
INTRAVENOUS | Status: DC | PRN
  Administered 2019-06-20: 50 ug/kg/min via INTRAVENOUS

## 2019-06-20 MED ORDER — PROPOFOL 10 MG/ML IV BOLUS
INTRAVENOUS | Status: DC | PRN
  Administered 2019-06-20: 20 mg via INTRAVENOUS
  Administered 2019-06-20: 40 mg via INTRAVENOUS
  Administered 2019-06-20 (×3): 20 mg via INTRAVENOUS

## 2019-06-20 MED ORDER — CHLORHEXIDINE GLUCONATE 4 % EX LIQD: 60.0000 mL | Freq: Once | CUTANEOUS | Status: DC

## 2019-06-20 MED ORDER — ACETAMINOPHEN 325 MG PO TABS: 325.0000 mg | ORAL_TABLET | Freq: Four times a day (QID) | ORAL | Status: DC | PRN

## 2019-06-20 MED ORDER — CELECOXIB 200 MG PO CAPS
ORAL_CAPSULE | ORAL | Status: AC
  Filled 2019-06-20: qty 1

## 2019-06-20 MED ORDER — PHENYLEPHRINE HCL (PRESSORS) 10 MG/ML IV SOLN
INTRAVENOUS | Status: AC
  Filled 2019-06-20: qty 1

## 2019-06-20 MED ORDER — ONDANSETRON HCL 4 MG/2ML IJ SOLN: 4.0000 mg | Freq: Four times a day (QID) | INTRAMUSCULAR | Status: DC | PRN

## 2019-06-20 MED ORDER — TRANEXAMIC ACID-NACL 1000-0.7 MG/100ML-% IV SOLN
1000.0000 mg | INTRAVENOUS | Status: AC
  Administered 2019-06-20: 1000 mg via INTRAVENOUS
  Filled 2019-06-20 (×2): qty 100

## 2019-06-20 MED ORDER — METOCLOPRAMIDE HCL 5 MG PO TABS: 5.0000 mg | ORAL_TABLET | Freq: Three times a day (TID) | ORAL | Status: DC | PRN

## 2019-06-20 MED ORDER — HYDROCODONE-ACETAMINOPHEN 5-325 MG PO TABS
1.0000 | ORAL_TABLET | ORAL | Status: DC | PRN
  Administered 2019-06-20 – 2019-06-22 (×5): 2 via ORAL
  Administered 2019-06-22 (×2): 1 via ORAL
  Administered 2019-06-22 – 2019-06-23 (×3): 2 via ORAL
  Filled 2019-06-20 (×4): qty 2

## 2019-06-20 MED ORDER — LIDOCAINE 2% (20 MG/ML) 5 ML SYRINGE
INTRAMUSCULAR | Status: AC
  Filled 2019-06-20: qty 5

## 2019-06-20 MED ORDER — LIDOCAINE 2% (20 MG/ML) 5 ML SYRINGE
INTRAMUSCULAR | Status: DC | PRN
  Administered 2019-06-20: 60 mg via INTRAVENOUS
  Administered 2019-06-20: 40 mg via INTRAVENOUS

## 2019-06-20 MED ORDER — SUCCINYLCHOLINE CHLORIDE 200 MG/10ML IV SOSY
PREFILLED_SYRINGE | INTRAVENOUS | Status: AC
  Filled 2019-06-20: qty 10

## 2019-06-20 MED ORDER — CEFAZOLIN SODIUM-DEXTROSE 2-4 GM/100ML-% IV SOLN
2.0000 g | Freq: Four times a day (QID) | INTRAVENOUS | Status: AC
  Administered 2019-06-20 – 2019-06-21 (×2): 2 g via INTRAVENOUS
  Filled 2019-06-20 (×2): qty 100

## 2019-06-20 MED ORDER — LACTATED RINGERS IV SOLN: INTRAVENOUS | Status: DC | PRN

## 2019-06-20 MED ORDER — PROMETHAZINE HCL 25 MG/ML IJ SOLN: 6.2500 mg | INTRAMUSCULAR | Status: DC | PRN

## 2019-06-20 MED ORDER — ISOPROPYL ALCOHOL 70 % SOLN
Status: DC | PRN
  Administered 2019-06-20: 1 via TOPICAL

## 2019-06-20 MED ORDER — ONDANSETRON HCL 4 MG/2ML IJ SOLN
INTRAMUSCULAR | Status: DC | PRN
  Administered 2019-06-20: 4 mg via INTRAVENOUS

## 2019-06-20 MED ORDER — POVIDONE-IODINE 10 % EX SWAB
2.0000 "application " | Freq: Once | CUTANEOUS | Status: AC
  Administered 2019-06-20: 2 via TOPICAL

## 2019-06-20 MED ORDER — ROCURONIUM BROMIDE 10 MG/ML (PF) SYRINGE
PREFILLED_SYRINGE | INTRAVENOUS | Status: AC
  Filled 2019-06-20: qty 10

## 2019-06-20 MED ORDER — METHOCARBAMOL 500 MG PO TABS: 500.0000 mg | ORAL_TABLET | Freq: Four times a day (QID) | ORAL | Status: DC | PRN

## 2019-06-20 MED ORDER — SODIUM CHLORIDE 0.9 % IR SOLN
Status: DC | PRN
  Administered 2019-06-20: 3000 mL

## 2019-06-20 MED ORDER — ONDANSETRON HCL 4 MG/2ML IJ SOLN
INTRAMUSCULAR | Status: AC
  Filled 2019-06-20: qty 2

## 2019-06-20 MED ORDER — DEXAMETHASONE SODIUM PHOSPHATE 10 MG/ML IJ SOLN
INTRAMUSCULAR | Status: AC
  Filled 2019-06-20: qty 1

## 2019-06-20 MED ORDER — KETOROLAC TROMETHAMINE 30 MG/ML IJ SOLN
INTRAMUSCULAR | Status: DC | PRN
  Administered 2019-06-20: 30 mg

## 2019-06-20 MED ORDER — ALBUMIN HUMAN 5 % IV SOLN: INTRAVENOUS | Status: DC | PRN

## 2019-06-20 MED ORDER — METHOCARBAMOL 500 MG IVPB - SIMPLE MED
500.0000 mg | Freq: Four times a day (QID) | INTRAVENOUS | Status: DC | PRN
  Administered 2019-06-20: 20:00:00 500 mg via INTRAVENOUS
  Filled 2019-06-20: qty 50

## 2019-06-20 MED ORDER — PHENYLEPHRINE HCL-NACL 10-0.9 MG/250ML-% IV SOLN
INTRAVENOUS | Status: DC | PRN
  Administered 2019-06-20: 25 ug/min via INTRAVENOUS

## 2019-06-20 MED ORDER — METOCLOPRAMIDE HCL 5 MG/ML IJ SOLN: 5.0000 mg | Freq: Three times a day (TID) | INTRAMUSCULAR | Status: DC | PRN

## 2019-06-20 MED ORDER — ACETAMINOPHEN 10 MG/ML IV SOLN
1000.0000 mg | INTRAVENOUS | Status: AC
  Administered 2019-06-20: 1000 mg via INTRAVENOUS
  Filled 2019-06-20 (×2): qty 100

## 2019-06-20 MED ORDER — ALBUMIN HUMAN 5 % IV SOLN: 12.5000 g | Freq: Once | INTRAVENOUS | Status: AC

## 2019-06-20 MED ORDER — KETOROLAC TROMETHAMINE 30 MG/ML IJ SOLN
INTRAMUSCULAR | Status: DC | PRN
  Administered 2019-06-20: 15 mg via INTRAVENOUS

## 2019-06-20 MED ORDER — POVIDONE-IODINE 10 % EX SWAB: 2.0000 "application " | Freq: Once | CUTANEOUS | Status: DC

## 2019-06-20 MED ORDER — PROPOFOL 10 MG/ML IV BOLUS
INTRAVENOUS | Status: AC
  Filled 2019-06-20: qty 20

## 2019-06-20 MED ORDER — ACETAMINOPHEN 650 MG RE SUPP: 650.0000 mg | Freq: Four times a day (QID) | RECTAL | Status: DC | PRN

## 2019-06-20 MED ORDER — BUPIVACAINE IN DEXTROSE 0.75-8.25 % IT SOLN
INTRATHECAL | Status: DC | PRN
  Administered 2019-06-20: 1.6 mg via INTRATHECAL

## 2019-06-20 MED ORDER — FENTANYL CITRATE (PF) 100 MCG/2ML IJ SOLN: 25.0000 ug | INTRAMUSCULAR | Status: DC | PRN

## 2019-06-20 MED ORDER — FENTANYL CITRATE (PF) 250 MCG/5ML IJ SOLN
INTRAMUSCULAR | Status: AC
  Filled 2019-06-20: qty 5

## 2019-06-20 MED ORDER — SENNA 8.6 MG PO TABS
1.0000 | ORAL_TABLET | Freq: Two times a day (BID) | ORAL | Status: DC
  Administered 2019-06-20 – 2019-06-23 (×6): 8.6 mg via ORAL
  Filled 2019-06-20 (×6): qty 1

## 2019-06-20 MED ORDER — AMLODIPINE BESYLATE 5 MG PO TABS
5.0000 mg | ORAL_TABLET | Freq: Every day | ORAL | Status: DC
  Administered 2019-06-21: 5 mg via ORAL
  Filled 2019-06-20: qty 1

## 2019-06-20 MED ORDER — HYDROCODONE-ACETAMINOPHEN 5-325 MG PO TABS
1.0000 | ORAL_TABLET | Freq: Four times a day (QID) | ORAL | Status: DC | PRN
  Filled 2019-06-20 (×4): qty 2
  Filled 2019-06-20 (×2): qty 1

## 2019-06-20 MED ORDER — KETOROLAC TROMETHAMINE 30 MG/ML IJ SOLN
INTRAMUSCULAR | Status: AC
  Filled 2019-06-20: qty 1

## 2019-06-20 MED ORDER — ENSURE PRE-SURGERY PO LIQD
296.0000 mL | Freq: Once | ORAL | Status: DC
  Filled 2019-06-20: qty 296

## 2019-06-20 MED ORDER — METHOCARBAMOL 500 MG IVPB - SIMPLE MED
INTRAVENOUS | Status: AC
  Filled 2019-06-20: qty 50

## 2019-06-20 MED ORDER — DEXTROSE 5 % IV SOLN: 3.0000 g | INTRAVENOUS | Status: DC

## 2019-06-20 MED ORDER — PANTOPRAZOLE SODIUM 40 MG PO TBEC
80.0000 mg | DELAYED_RELEASE_TABLET | Freq: Every day | ORAL | Status: DC
  Administered 2019-06-21 – 2019-06-23 (×3): 80 mg via ORAL
  Filled 2019-06-20 (×3): qty 2

## 2019-06-20 MED ORDER — CEFAZOLIN SODIUM-DEXTROSE 2-4 GM/100ML-% IV SOLN
2.0000 g | INTRAVENOUS | Status: AC
  Administered 2019-06-20: 2 g via INTRAVENOUS
  Filled 2019-06-20: qty 100

## 2019-06-20 MED ORDER — FENTANYL CITRATE (PF) 250 MCG/5ML IJ SOLN
INTRAMUSCULAR | Status: DC | PRN
  Administered 2019-06-20 (×2): 25 ug via INTRAVENOUS
  Administered 2019-06-20: 50 ug via INTRAVENOUS

## 2019-06-20 MED ORDER — ALBUMIN HUMAN 5 % IV SOLN
INTRAVENOUS | Status: AC
  Administered 2019-06-20: 20:00:00 12.5 g via INTRAVENOUS
  Filled 2019-06-20: qty 250

## 2019-06-20 MED ORDER — ENOXAPARIN SODIUM 40 MG/0.4ML ~~LOC~~ SOLN
40.0000 mg | SUBCUTANEOUS | Status: DC
  Administered 2019-06-21 – 2019-06-23 (×3): 40 mg via SUBCUTANEOUS
  Filled 2019-06-20 (×3): qty 0.4

## 2019-06-20 MED ORDER — BUPIVACAINE-EPINEPHRINE 0.25% -1:200000 IJ SOLN
INTRAMUSCULAR | Status: DC | PRN
  Administered 2019-06-20: 30 mL

## 2019-06-20 SURGICAL SUPPLY — 58 items
ADH SKN CLS APL DERMABOND .7 (GAUZE/BANDAGES/DRESSINGS) ×2
APL PRP STRL LF DISP 70% ISPRP (MISCELLANEOUS) ×1
BAG DECANTER FOR FLEXI CONT (MISCELLANEOUS) IMPLANT
BAG SPEC THK2 15X12 ZIP CLS (MISCELLANEOUS)
BAG ZIPLOCK 12X15 (MISCELLANEOUS) IMPLANT
BLADE SURG SZ10 CARB STEEL (BLADE) IMPLANT
CHLORAPREP W/TINT 26 (MISCELLANEOUS) ×2 IMPLANT
COVER PERINEAL POST (MISCELLANEOUS) ×2 IMPLANT
COVER SURGICAL LIGHT HANDLE (MISCELLANEOUS) ×2 IMPLANT
COVER WAND RF STERILE (DRAPES) IMPLANT
DECANTER SPIKE VIAL GLASS SM (MISCELLANEOUS) ×2 IMPLANT
DERMABOND ADVANCED (GAUZE/BANDAGES/DRESSINGS) ×2
DERMABOND ADVANCED .7 DNX12 (GAUZE/BANDAGES/DRESSINGS) ×2 IMPLANT
DRAPE IMP U-DRAPE 54X76 (DRAPES) ×2 IMPLANT
DRAPE SHEET LG 3/4 BI-LAMINATE (DRAPES) ×6 IMPLANT
DRAPE STERI IOBAN 125X83 (DRAPES) IMPLANT
DRAPE U-SHAPE 47X51 STRL (DRAPES) ×4 IMPLANT
DRSG AQUACEL AG ADV 3.5X10 (GAUZE/BANDAGES/DRESSINGS) ×2 IMPLANT
ELECT REM PT RETURN 15FT ADLT (MISCELLANEOUS) ×2 IMPLANT
GAUZE SPONGE 4X4 12PLY STRL (GAUZE/BANDAGES/DRESSINGS) ×2 IMPLANT
GLOVE BIO SURGEON STRL SZ8.5 (GLOVE) ×4 IMPLANT
GLOVE BIOGEL PI IND STRL 8.5 (GLOVE) ×1 IMPLANT
GLOVE BIOGEL PI INDICATOR 8.5 (GLOVE) ×1
GOWN SPEC L3 XXLG W/TWL (GOWN DISPOSABLE) ×2 IMPLANT
HANDPIECE INTERPULSE COAX TIP (DISPOSABLE) ×2
HOLDER FOLEY CATH W/STRAP (MISCELLANEOUS) ×2 IMPLANT
HOOD PEEL AWAY FLYTE STAYCOOL (MISCELLANEOUS) ×9 IMPLANT
INSERT POLYETHYLENE 36M-0 (Insert) ×1 IMPLANT
JET LAVAGE IRRISEPT WOUND (IRRIGATION / IRRIGATOR) ×2
KIT TURNOVER KIT A (KITS) IMPLANT
LAVAGE JET IRRISEPT WOUND (IRRIGATION / IRRIGATOR) ×1 IMPLANT
LINER HIP HD LFIT 36/-5 (Joint) ×1 IMPLANT
MANIFOLD NEPTUNE II (INSTRUMENTS) ×2 IMPLANT
MARKER SKIN DUAL TIP RULER LAB (MISCELLANEOUS) ×2 IMPLANT
NDL SAFETY ECLIPSE 18X1.5 (NEEDLE) ×1 IMPLANT
NDL SPNL 18GX3.5 QUINCKE PK (NEEDLE) ×1 IMPLANT
NEEDLE HYPO 18GX1.5 SHARP (NEEDLE) ×2
NEEDLE SPNL 18GX3.5 QUINCKE PK (NEEDLE) ×2 IMPLANT
PACK ANTERIOR HIP CUSTOM (KITS) ×2 IMPLANT
PENCIL SMOKE EVACUATOR (MISCELLANEOUS) IMPLANT
SAW OSC TIP CART 19.5X105X1.3 (SAW) ×2 IMPLANT
SEALER BIPOLAR AQUA 6.0 (INSTRUMENTS) ×2 IMPLANT
SET HNDPC FAN SPRY TIP SCT (DISPOSABLE) ×1 IMPLANT
SHELL TRIDENT II CLUST 50 (Shell) ×1 IMPLANT
STEM ACCOLADE II SZ5 (Stem) ×1 IMPLANT
SUT ETHIBOND NAB CT1 #1 30IN (SUTURE) ×4 IMPLANT
SUT MNCRL AB 3-0 PS2 18 (SUTURE) ×2 IMPLANT
SUT MNCRL AB 4-0 PS2 18 (SUTURE) ×2 IMPLANT
SUT MON AB 2-0 CT1 36 (SUTURE) ×4 IMPLANT
SUT STRATAFIX PDO 1 14 VIOLET (SUTURE) ×2
SUT STRATFX PDO 1 14 VIOLET (SUTURE) ×1
SUT VIC AB 2-0 CT1 27 (SUTURE) ×2
SUT VIC AB 2-0 CT1 TAPERPNT 27 (SUTURE) ×1 IMPLANT
SUTURE STRATFX PDO 1 14 VIOLET (SUTURE) ×1 IMPLANT
SYR 3ML LL SCALE MARK (SYRINGE) ×2 IMPLANT
TRAY FOLEY MTR SLVR 16FR STAT (SET/KITS/TRAYS/PACK) IMPLANT
WATER STERILE IRR 1000ML POUR (IV SOLUTION) ×2 IMPLANT
YANKAUER SUCT BULB TIP 10FT TU (MISCELLANEOUS) ×2 IMPLANT

## 2019-06-20 NOTE — Anesthesia Preprocedure Evaluation (Addendum)
Anesthesia Evaluation  Patient identified by MRN, date of birth, ID band Patient awake    Reviewed: Allergy & Precautions, NPO status , Patient's Chart, lab work & pertinent test results  History of Anesthesia Complications Negative for: history of anesthetic complications  Airway Mallampati: II  TM Distance: >3 FB Neck ROM: Full    Dental  (+) Upper Dentures, Dental Advisory Given   Pulmonary neg pulmonary ROS,    Pulmonary exam normal        Cardiovascular hypertension, Pt. on medications Normal cardiovascular exam     Neuro/Psych negative neurological ROS     GI/Hepatic Neg liver ROS, GERD  ,  Endo/Other  negative endocrine ROS  Renal/GU negative Renal ROS     Musculoskeletal negative musculoskeletal ROS (+)   Abdominal   Peds  Hematology negative hematology ROS (+)   Anesthesia Other Findings Day of surgery medications reviewed with the patient.  Reproductive/Obstetrics                            Anesthesia Physical Anesthesia Plan  ASA: III  Anesthesia Plan: Spinal and MAC   Post-op Pain Management:    Induction:   PONV Risk Score and Plan: 2 and Ondansetron and Propofol infusion  Airway Management Planned: Natural Airway  Additional Equipment:   Intra-op Plan:   Post-operative Plan:   Informed Consent: I have reviewed the patients History and Physical, chart, labs and discussed the procedure including the risks, benefits and alternatives for the proposed anesthesia with the patient or authorized representative who has indicated his/her understanding and acceptance.     Dental advisory given  Plan Discussed with: Anesthesiologist  Anesthesia Plan Comments:        Anesthesia Quick Evaluation

## 2019-06-20 NOTE — ED Notes (Signed)
Pt assisted to lay supine, pillow wedge placed under right hip.   Purewick applied, 45-50 mmHg setting.

## 2019-06-20 NOTE — Consult Note (Signed)
ORTHOPAEDIC CONSULTATION  REQUESTING PHYSICIAN: [place], Alvira Philips, DO  PCP:  [name], MD  Chief Complaint: left hip injury  HPI: [name] is a 79 y.o. female who is an established patient with [facility] who presents to the emergency department after a ground-level fall earlier today.  She had left hip pain and inability to weight-bear.  X-rays and CT scan of the left hip demonstrate a valgus impacted femoral neck fracture with retroversion of the head.  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  She has been able to maintain full extension without lag, so this was treated conservatively.  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  She also has history of left clavicle fracture at birth with left shoulder weakness and stiffness.  She reports that she normally ambulates without any assist device at all.  Sometimes she uses a walker.  She denies other injuries.  She did have a ground-level fall in December 2020, injuring her right foot.  She reports that radiographs were negative, and she has been recovering in a hard soled shoe.  She reports that her foot is doing well and nearly back at baseline.  Past Medical History:  Diagnosis Date  . Allergy   . Cataract   . Congenital defect    left arm  . GERD (gastroesophageal reflux disease)   . IBS (irritable bowel syndrome)   . Osteopenia   . Paralysis (HCC) since birth   RLE weakness; L shoulder weakness (h/o clavicle fracture at birth, limited ROM)  . Unspecified vitamin D deficiency 2010   Past Surgical History:  Procedure Laterality Date  . CATARACT EXTRACTION     both eyes  . CHOLECYSTECTOMY    . 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 Despina Hick)   Social History   Socioeconomic History  . Marital status: Married    Spouse name: Not on file  . Number of children: 6  . Years of  education: Not on file  . Highest education level: Not on file  Occupational History  . 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 Smoking status: Never Smoker  . Smokeless tobacco: Never Used  Substance and Sexual Activity  . Alcohol use: No  . Drug use: No  . Sexual activity: Not on file  Other Topics Concern  . Not on file  Social History Narrative  . Not on file   Social Determinants of Health   Financial Resource Strain:   . Difficulty of Paying Living Expenses: Not on file  Food Insecurity:   . Worried About Programme researcher, broadcasting/film/video in the Last Year: Not on file  . Ran Out of Food in the Last Year: Not on file  Transportation Needs:   . Lack of Transportation (Medical): Not on file  . Lack of Transportation (Non-Medical): Not on file  Physical Activity:   . Days of Exercise per Week: Not on file  . Minutes of Exercise per Session: Not on file  Stress:   . Feeling of Stress : Not on file  Social Connections:   . Frequency of Communication with Friends and Family: Not on file  . Frequency of Social Gatherings with Friends and Family: Not on file  . Attends Religious Services: Not on file  . Active Member of Clubs or Organizations: Not  on file  . Attends Archivist Meetings: Not on file  . Marital Status: Not on file   Family History  Problem Relation Age of Onset  . Stroke Mother   . Vision loss Mother   . Vision loss Father   . Stroke Father   . Cancer Sister 46       breast cancer  . Vision loss Brother   . Diabetes Neg Hx   . Heart disease Neg Hx    Allergies  Allergen Reactions  . Sulfa Drugs Cross Reactors Rash   Prior to Admission medications   Medication Sig Start Date End Date Taking? Authorizing Provider  acetaminophen (TYLENOL) 500 MG tablet Take 1,000 mg by mouth every 6 (six) hours as needed for mild pain or moderate pain.   Yes [provider]  alendronate (FOSAMAX) 70 MG  tablet Take 70 mg by mouth once a week. Thursday 05/31/19  Yes [provider]  amLODipine (NORVASC) 5 MG tablet Take 5 mg by mouth daily. 05/11/19  Yes [provider]  aspirin 81 MG EC tablet Take 81 mg by mouth every morning.    Yes [provider]  Cholecalciferol (VITAMIN D3 PO) Take 1 tablet by mouth at bedtime.   Yes [provider]  esomeprazole (NEXIUM) 40 MG capsule Take 40 mg by mouth daily.  04/22/19  Yes [provider]  Multiple Vitamins-Minerals (MULTIVITAMIN WITH MINERALS) tablet Take 1 tablet by mouth daily.   Yes [provider]  ondansetron (ZOFRAN ODT) 4 MG disintegrating tablet Take 1 tablet (4 mg total) by mouth every 8 (eight) hours as needed for nausea or vomiting. Patient not taking: Reported on 06/20/2019 02/12/19   Orvan July, NP   CT Knee Left Wo Contrast  Result Date: 06/20/2019 CLINICAL DATA:  Left knee pain after a fall. EXAM: CT OF THE LEFT KNEE WITHOUT CONTRAST TECHNIQUE: Multidetector CT imaging of the left knee was performed according to the standard protocol. Multiplanar CT image reconstructions were also generated. COMPARISON:  Radiographs dated 06/20/2019 and 11/03/2013 FINDINGS: Bones/Joint/Cartilage There is no visible fracture of the left knee. The components of the total knee prosthesis demonstrate no evidence of loosening. 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 The detail of the patellar component of the prosthesis is partially obscured by the metallic artifact from the distal femoral component. No appreciable joint effusion. Muscles and Tendons Negative. Soft tissues Negative. IMPRESSION: No acute abnormality of the left knee. 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 Electronically Signed   By: Lorriane Shire M.D.   On: 06/20/2019 13:34   CT Hip Left Wo Contrast  Result Date: 06/20/2019 CLINICAL DATA:  The  patient suffered a fall today when her left knee gave way. Left hip pain. Initial encounter. EXAM: CT OF THE LEFT HIP WITHOUT CONTRAST TECHNIQUE: Multidetector CT imaging of the left hip was performed according to the standard protocol. Multiplanar CT image reconstructions were also generated. COMPARISON:  Plain films left hip earlier today. FINDINGS: Bones/Joint/Cartilage The patient has an acute subcapital fracture of the left hip with mild impaction at the superior margin of the fracture. The femoral head is located. No other fracture is identified. No evidence of arthropathy about the hip. No focal bone lesion. Ligaments Suboptimally assessed by CT. Muscles and Tendons Intact and normal in appearance. Soft tissues Imaged intrapelvic contents demonstrate no acute abnormality. IMPRESSION: Acute subcapital fracture left hip. Electronically Signed  By: Drusilla Kanner M.D.   On: 06/20/2019 13:25   DG Knee Complete 4 Views Left  Result Date: 06/20/2019 CLINICAL DATA:  Fall EXAM: LEFT KNEE - COMPLETE 4+ VIEW COMPARISON:  11/03/2013 FINDINGS: Status post left knee total arthroplasty. There is redemonstrated fragmentation and distraction of the left patella, unchanged in appearance when compared to examination dated 11/03/2013. No evidence of perihardware fracture or loosening. Soft tissues are unremarkable. IMPRESSION: Status post left knee total arthroplasty. There is redemonstrated fragmentation and distraction of the left patella, unchanged in appearance when compared to examination dated 11/03/2013. No evidence of perihardware fracture or loosening. Electronically Signed   By: Lauralyn Primes M.D.   On: 06/20/2019 12:02   DG Hip Unilat With Pelvis 2-3 Views Left  Result Date: 06/20/2019 CLINICAL DATA:  Left hip pain secondary to a fall. EXAM: DG HIP (WITH OR WITHOUT PELVIS) 2-3V LEFT COMPARISON:  Pelvic radiograph dated 11/07/2010. FINDINGS: There is no dislocation. The patient was unable to be positioned  for standard projections. There is no discrete fracture of the pelvis or of the hips. However, I do not feel that the right femoral neck is adequately assessed on the available images. CT scan of hip is recommended for further evaluation. IMPRESSION: No visible fractures of the pelvis or hips. However, the patient was unable to be positioned for standard projections. Recommend CT scan of the hips for further evaluation because I cannot completely clear the left femoral neck. Electronically Signed   By: Francene Boyers M.D.   On: 06/20/2019 13:00    Positive ROS: All other systems have been reviewed and were otherwise negative with the exception of those mentioned in the HPI and as above.  Physical Exam: General: Alert, no acute distress Cardiovascular: No pedal edema Respiratory: No cyanosis, no use of accessory musculature GI: No organomegaly, abdomen is soft and non-tender Skin: No lesions in the area of chief complaint Neurologic: Sensation intact distally Psychiatric: Patient is competent for consent with normal mood and affect Lymphatic: No axillary or cervical lymphadenopathy  MUSCULOSKELETAL: Examination of the left hip reveals no skin wounds or lesions.  She is lying with the hip externally rotated.  She has severe pain with attempted logrolling of the hip.  She cannot perform a straight leg raise secondary to pain.  She has intact motor function dorsiflexion, plantarflexion, and great toe extension.  Palpable pulses.  Reports intact sensation.  Assessment: Displaced left femoral neck fracture History of right-sided paralysis  Plan: I discussed the findings with the patient.  She has an unstable left femoral neck fracture.  We discussed that the procedure that will give her the most predictable outcome is a total hip replacement.  We discussed the risk, benefits, and alternatives.  Please see statement of risk. Covid-19 test is pending.  Hospitalist has been consulted by EDP for  admission.  Plan for surgery today if scheduling allows.  All questions were solicited and answered.  The risks, benefits, and alternatives were discussed with the patient. There are risks associated with the surgery including, but not limited to, problems with anesthesia (death), infection, instability (giving out of the joint), dislocation, differences in leg length/angulation/rotation, fracture of bones, loosening or failure of implants, hematoma (blood accumulation) which may require surgical drainage, blood clots, pulmonary embolism, nerve injury (foot drop and lateral thigh numbness), and blood vessel injury. The patient understands these risks and elects to proceed.   Jonette Pesa, MD [phone]    06/20/2019 3:01 PM

## 2019-06-20 NOTE — Transfer of Care (Signed)
Immediate Anesthesia Transfer of Care Note  Patient: [name]  Procedure(s) Performed: TOTAL HIP ARTHROPLASTY ANTERIOR APPROACH (Left Hip)  Patient Location: PACU  Anesthesia Type:Spinal  Level of Consciousness: awake and alert   Airway & Oxygen Therapy: Patient Spontanous Breathing and Patient connected to face mask oxygen  Post-op Assessment: Report given to RN and Post -op Vital signs reviewed and stable  Post vital signs: Reviewed and stable  Last Vitals:  Vitals Value Taken Time  BP 126/53 06/20/19 1930  Temp 36.8 C 06/20/19 1920  Pulse 56 06/20/19 1934  Resp 12 06/20/19 1934  SpO2 100 % 06/20/19 1934  Vitals shown include unvalidated device data.  Last Pain:  Vitals:   06/20/19 1622  TempSrc:   PainSc: 2       Patients Stated Pain Goal: 4 (06/20/19 1622)  Complications: No apparent anesthesia complications

## 2019-06-20 NOTE — H&P (Signed)
History and Physical    SANDE PICKERT DSK:[id number] DOB: May 25, 1941 DOA: 06/20/2019  PCP: [name], MD  Patient coming from: Home  I have personally briefly reviewed patient's old medical records in [facility]  Chief Complaint: Fall, left hip pain  HPI: [name] is a 79 y.o. female with medical history significant of hypertension, GERD and history of Covid-19 viral infection in September 2020 who presents to the ER following fall with subsequent left hip pain.  Patient states was walking with her walker and tripped and fell and landed on her left side.  No loss of consciousness, no other injuries sustained.  No other specific complaints at this time.  Pain currently controlled.  Denies headache, no fever/chills/night sweats, no visual changes, no chest pain, no palpitations, no shortness of breath, no cough/congestion, no abdominal pain, no weakness, no fatigue.  ED Course: Temperature 97.9, HR 84, RR 19, BP 139/67, SPO2 100% on room air.  WBC count 10.1, hemoglobin 12.3, platelet 156.  Sodium 141, potassium 3.7, chloride 108, CO2 26, BUN 19, creatinine 1.04, glucose 175.  Acute cardiopulmonary findings.  CT left knee without acute abnormality.  CT left hip with acute subcapital fracture left hip.  EDP consulted [facility]drologist) who requested hospitalist admission for likely need of operative management.  Review of Systems: As per HPI otherwise 10 point review of systems negative.    Past Medical History:  Diagnosis Date  . Allergy   . Cataract   . Congenital defect    left arm  . GERD (gastroesophageal reflux disease)   . IBS (irritable bowel syndrome)   . Osteopenia   . Paralysis (HCC) since birth   RLE weakness; L shoulder weakness (h/o clavicle fracture at birth, limited ROM)  . Unspecified vitamin D deficiency 2010    Past Surgical History:  Procedure Laterality Date  . CATARACT EXTRACTION     both eyes  . CHOLECYSTECTOMY    . JOINT REPLACEMENT  2006    L total knee (Dr. Despina Hick)     reports that she has never smoked. She has never used smokeless tobacco. She reports that she does not drink alcohol or use drugs.  Allergies  Allergen Reactions  . Sulfa Drugs Cross Reactors Rash    Family History  Problem Relation Age of Onset  . Stroke Mother   . Vision loss Mother   . Vision loss Father   . Stroke Father   . Cancer Sister 12       breast cancer  . Vision loss Brother   . Diabetes Neg Hx   . Heart disease Neg Hx     Family history reviewed and not pertinent   Prior to Admission medications   Medication Sig Start Date End Date Taking? Authorizing Provider  acetaminophen (TYLENOL) 500 MG tablet Take 1,000 mg by mouth every 6 (six) hours as needed for mild pain or moderate pain.   Yes [provider]  alendronate (FOSAMAX) 70 MG tablet Take 70 mg by mouth once a week. Thursday 05/31/19  Yes [provider]  amLODipine (NORVASC) 5 MG tablet Take 5 mg by mouth daily. 05/11/19  Yes [provider]  aspirin 81 MG EC tablet Take 81 mg by mouth every morning.    Yes [provider]  Cholecalciferol (VITAMIN D3 PO) Take 1 tablet by mouth at bedtime.   Yes [provider]  esomeprazole (NEXIUM) 40 MG capsule Take 40 mg by mouth daily.  04/22/19  Yes  [provider]  Multiple Vitamins-Minerals (MULTIVITAMIN WITH MINERALS) tablet Take 1 tablet by mouth daily.   Yes [provider]  ondansetron (ZOFRAN ODT) 4 MG disintegrating tablet Take 1 tablet (4 mg total) by mouth every 8 (eight) hours as needed for nausea or vomiting. Patient not taking: Reported on 06/20/2019 02/12/19   Orvan July, NP    Physical Exam: Vitals:   06/20/19 1109 06/20/19 1200 06/20/19 1230 06/20/19 1347  BP: 136/67 138/65 (!) 144/77 139/67  Pulse: (!) 55 75 78 84  Resp: 17  15 19   Temp: 97.9 F (36.6 C)     TempSrc: Oral     SpO2: 98% 96% 100% 100%    Constitutional: NAD, calm, comfortable Eyes:  PERRL, lids and conjunctivae normal ENMT: Mucous membranes are moist. Posterior pharynx clear of any exudate or lesions.Normal dentition.  Neck: normal, supple, no masses, no thyromegaly Respiratory: clear to auscultation bilaterally, no wheezing, no crackles. Normal respiratory effort. No accessory muscle use.  Cardiovascular: Regular rate and rhythm, no murmurs / rubs / gallops. No extremity edema. 2+ pedal pulses. No carotid bruits.  Abdomen: no tenderness, no masses palpated. No hepatosplenomegaly. Bowel sounds positive.  Musculoskeletal: Mild tenderness to palpation left hip, slightly shortened Skin: no rashes, lesions, ulcers. No induration Neurologic: CN 2-12 grossly intact. Sensation intact, DTR normal. Strength 5/5 in all 4.  Psychiatric: Normal judgment and insight. Alert and oriented x 3. Normal mood.    Labs on Admission: I have personally reviewed following labs and imaging studies  CBC: Recent Labs  Lab 06/20/19 1245  WBC 10.1  NEUTROABS 7.7  HGB 12.3  HCT 36.9  MCV 93.9  PLT 272   Basic Metabolic Panel: Recent Labs  Lab 06/20/19 1245  NA 141  K 3.7  CL 108  CO2 26  GLUCOSE 175*  BUN 19  CREATININE 1.04*  CALCIUM 8.6*   GFR: CrCl cannot be calculated (Unknown ideal weight.). Liver Function Tests: No results for input(s): AST, ALT, ALKPHOS, BILITOT, PROT, ALBUMIN in the last 168 hours. No results for input(s): LIPASE, AMYLASE in the last 168 hours. No results for input(s): AMMONIA in the last 168 hours. Coagulation Profile: No results for input(s): INR, PROTIME in the last 168 hours. Cardiac Enzymes: No results for input(s): CKTOTAL, CKMB, CKMBINDEX, TROPONINI in the last 168 hours. BNP (last 3 results) No results for input(s): PROBNP in the last 8760 hours. HbA1C: No results for input(s): HGBA1C in the last 72 hours. CBG: No results for input(s): GLUCAP in the last 168 hours. Lipid Profile: No results for input(s): CHOL, HDL, LDLCALC, TRIG,  CHOLHDL, LDLDIRECT in the last 72 hours. Thyroid Function Tests: No results for input(s): TSH, T4TOTAL, FREET4, T3FREE, THYROIDAB in the last 72 hours. Anemia Panel: No results for input(s): VITAMINB12, FOLATE, FERRITIN, TIBC, IRON, RETICCTPCT in the last 72 hours. Urine analysis:    Component Value Date/Time   COLORURINE AMBER (A) 01/27/2014 1810   APPEARANCEUR CLOUDY (A) 01/27/2014 1810   LABSPEC 1.014 01/27/2014 1810   PHURINE 6.0 01/27/2014 1810   GLUCOSEU NEGATIVE 01/27/2014 1810   HGBUR SMALL (A) 01/27/2014 1810   BILIRUBINUR NEGATIVE 01/27/2014 1810   BILIRUBINUR neg 10/09/2010 1500   KETONESUR NEGATIVE 01/27/2014 1810   PROTEINUR NEGATIVE 01/27/2014 1810   UROBILINOGEN 0.2 01/27/2014 1810   NITRITE NEGATIVE 01/27/2014 1810   LEUKOCYTESUR SMALL (A) 01/27/2014 1810    Radiological Exams on Admission: CT Knee Left Wo Contrast  Result Date: 06/20/2019 CLINICAL DATA:  Left knee pain  after a fall. EXAM: CT OF THE LEFT KNEE WITHOUT CONTRAST TECHNIQUE: Multidetector CT imaging of the left knee was performed according to the standard protocol. Multiplanar CT image reconstructions were also generated. COMPARISON:  Radiographs dated 06/20/2019 and 11/03/2013 FINDINGS: Bones/Joint/Cartilage There is no visible fracture of the left knee. The components of the total knee prosthesis demonstrate no evidence of loosening. 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 The detail of the patellar component of the prosthesis is partially obscured by the metallic artifact from the distal femoral component. No appreciable joint effusion. Muscles and Tendons Negative. Soft tissues Negative. IMPRESSION: No acute abnormality of the left knee. 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 Electronically Signed   By: Francene Boyers M.D.   On: 06/20/2019 13:34   CT Hip Left Wo Contrast  Result Date: 06/20/2019 CLINICAL  DATA:  The patient suffered a fall today when her left knee gave way. Left hip pain. Initial encounter. EXAM: CT OF THE LEFT HIP WITHOUT CONTRAST TECHNIQUE: Multidetector CT imaging of the left hip was performed according to the standard protocol. Multiplanar CT image reconstructions were also generated. COMPARISON:  Plain films left hip earlier today. FINDINGS: Bones/Joint/Cartilage The patient has an acute subcapital fracture of the left hip with mild impaction at the superior margin of the fracture. The femoral head is located. No other fracture is identified. No evidence of arthropathy about the hip. No focal bone lesion. Ligaments Suboptimally assessed by CT. Muscles and Tendons Intact and normal in appearance. Soft tissues Imaged intrapelvic contents demonstrate no acute abnormality. IMPRESSION: Acute subcapital fracture left hip. Electronically Signed   By: Drusilla Kanner M.D.   On: 06/20/2019 13:25   DG Knee Complete 4 Views Left  Result Date: 06/20/2019 CLINICAL DATA:  Fall EXAM: LEFT KNEE - COMPLETE 4+ VIEW COMPARISON:  11/03/2013 FINDINGS: Status post left knee total arthroplasty. There is redemonstrated fragmentation and distraction of the left patella, unchanged in appearance when compared to examination dated 11/03/2013. No evidence of perihardware fracture or loosening. Soft tissues are unremarkable. IMPRESSION: Status post left knee total arthroplasty. There is redemonstrated fragmentation and distraction of the left patella, unchanged in appearance when compared to examination dated 11/03/2013. No evidence of perihardware fracture or loosening. Electronically Signed   By: Lauralyn Primes M.D.   On: 06/20/2019 12:02   DG Hip Unilat With Pelvis 2-3 Views Left  Result Date: 06/20/2019 CLINICAL DATA:  Left hip pain secondary to a fall. EXAM: DG HIP (WITH OR WITHOUT PELVIS) 2-3V LEFT COMPARISON:  Pelvic radiograph dated 11/07/2010. FINDINGS: There is no dislocation. The patient was unable to be  positioned for standard projections. There is no discrete fracture of the pelvis or of the hips. However, I do not feel that the right femoral neck is adequately assessed on the available images. CT scan of hip is recommended for further evaluation. IMPRESSION: No visible fractures of the pelvis or hips. However, the patient was unable to be positioned for standard projections. Recommend CT scan of the hips for further evaluation because I cannot completely clear the left femoral neck. Electronically Signed   By: Francene Boyers M.D.   On: 06/20/2019 13:00    EKG: Independently reviewed.   Assessment/Plan Principal Problem:   Hip fracture (HCC) Active Problems:   GERD (gastroesophageal reflux disease)   Benign essential HTN    Left hip fracture Patient presenting with mechanical fall while ambulating with her walker this morning.  No loss of consciousness.  CT left hip notable for acute subcapital left hip fracture. --Inpatient, telemetry --[facility] consulted, Dr. Linna Caprice; plan operative management either later today or tomorrow --Continue n.p.o. --Holding chemical DVT prophylaxis, SCDs --Check vitamin D-25 level --Pain control with Norco, morphine, Robaxin prn --PT/OT to see following surgery  Essential hypertension BP 139/67, fairly well controlled --Continue amlodipine 5 mg p.o. daily  GERD: Continue PPI  Preoperative risk assessment Patient is an 79 year old female with past medical history remarkable for hypertension and GERD.  No previous history of ischemic heart disease, no congestive heart failure, no CVA, with a normal creatinine level.  Patient is a class I risk for a minimal/moderate risk surgery correlating with a 3.9% 30-day risk of death, MI or cardiac arrest.  Given these findings, it is reasonable to proceed with surgical invention in this patient's as benefits clearly outweigh the risks in terms of her mobility/disability with her left hip fracture.  No further  cardiac evaluation needed at this point.   DVT prophylaxis: SCDs, holding chemical DVT prophylaxis in anticipation of surgery intervention, further per orthopedics Code Status: Full code Family Communication: Discussed plan of care extensively with patient at bedside Disposition Plan: To be determined following surgical intervention and therapy evaluation; currently lives at home with husband and daughter Consults called: [facility]ist) Admission status: Inpatient, telemetry   Severity of Illness: The appropriate patient status for this patient is INPATIENT. Inpatient status is judged to be reasonable and necessary in order to provide the required intensity of service to ensure the patient's safety. The patient's presenting symptoms, physical exam findings, and initial radiographic and laboratory data in the context of their chronic comorbidities is felt to place them at high risk for further clinical deterioration. Furthermore, it is not anticipated that the patient will be medically stable for discharge from the hospital within 2 midnights of admission. The following factors support the patient status of inpatient.   " The patient's presenting symptoms include left hip pain " The worrisome physical exam findings include left hip pain on palpation, left lower extremity shortening " The initial radiographic and laboratory data are worrisome because of left hip fracture " The chronic co-morbidities include hypertension, GERD.   * I certify that at the point of admission it is my clinical judgment that the patient will require inpatient hospital care spanning beyond 2 midnights from the point of admission due to high intensity of service, high risk for further deterioration and high frequency of surveillance required.*    Alvira Philips [place] DO [facility] Available via Epic secure chat 7am-7pm After these hours, please refer to coverage provider listed on [email or web address] 06/20/2019,  2:51 PM

## 2019-06-20 NOTE — Discharge Instructions (Signed)
°Dr. Aquil Duhe °Joint Replacement Specialist °[facility] °[address] °[place], [place] [address] °[phone] ° ° °TOTAL HIP REPLACEMENT POSTOPERATIVE DIRECTIONS ° ° ° °Hip Rehabilitation, Guidelines Following Surgery  ° °WEIGHT BEARING °Weight bearing as tolerated with assist device (walker, cane, etc) as directed, use it as long as suggested by your surgeon or therapist, typically at least 4-6 weeks. ° °The results of a hip operation are greatly improved after range of motion and muscle strengthening exercises. Follow all safety measures which are given to protect your hip. If any of these exercises cause increased pain or swelling in your joint, decrease the amount until you are comfortable again. Then slowly increase the exercises. Call your caregiver if you have problems or questions.  ° °HOME CARE INSTRUCTIONS  °Most of the following instructions are designed to prevent the dislocation of your new hip.  °Remove items at home which could result in a fall. This includes throw rugs or furniture in walking pathways.  °Continue medications as instructed at time of discharge. °· You may have some home medications which will be placed on hold until you complete the course of blood thinner medication. °· You may start showering once you are discharged home. Do not remove your dressing. °Do not put on socks or shoes without following the instructions of your caregivers.   °Sit on chairs with arms. Use the chair arms to help push yourself up when arising.  °Arrange for the use of a toilet seat elevator so you are not sitting low.  °· Walk with walker as instructed.  °You may resume a sexual relationship in one month or when given the OK by your caregiver.  °Use walker as long as suggested by your caregivers.  °You may put full weight on your legs and walk as much as is comfortable. °Avoid periods of inactivity such as sitting longer than an hour when not asleep. This helps prevent  blood clots.  °You may return to work once you are cleared by your surgeon.  °Do not drive a car for 6 weeks or until released by your surgeon.  °Do not drive while taking narcotics.  °Wear elastic stockings for two weeks following surgery during the day but you may remove then at night.  °Make sure you keep all of your appointments after your operation with all of your doctors and caregivers. You should call the office at the above phone number and make an appointment for approximately two weeks after the date of your surgery. °Please pick up a stool softener and laxative for home use as long as you are requiring pain medications. °· ICE to the affected hip every three hours for 30 minutes at a time and then as needed for pain and swelling. Continue to use ice on the hip for pain and swelling from surgery. You may notice swelling that will progress down to the foot and ankle.  This is normal after surgery.  Elevate the leg when you are not up walking on it.   °It is important for you to complete the blood thinner medication as prescribed by your doctor. °· Continue to use the breathing machine which will help keep your temperature down.  It is common for your temperature to cycle up and down following surgery, especially at night when you are not up moving around and exerting yourself.  The breathing machine keeps your lungs expanded and your temperature down. ° °RANGE OF MOTION AND STRENGTHENING EXERCISES  °These exercises are   designed to help you keep full movement of your hip joint. Follow your caregiver's or physical therapist's instructions. Perform all exercises about fifteen times, three times per day or as directed. Exercise both hips, even if you have had only one joint replacement. These exercises can be done on a training (exercise) mat, on the floor, on a table or on a bed. Use whatever works the best and is most comfortable for you. Use music or television while you are exercising so that the exercises  are a pleasant break in your day. This will make your life better with the exercises acting as a break in routine you can look forward to.  °Lying on your back, slowly slide your foot toward your buttocks, raising your knee up off the floor. Then slowly slide your foot back down until your leg is straight again.  °Lying on your back spread your legs as far apart as you can without causing discomfort.  °Lying on your side, raise your upper leg and foot straight up from the floor as far as is comfortable. Slowly lower the leg and repeat.  °Lying on your back, tighten up the muscle in the front of your thigh (quadriceps muscles). You can do this by keeping your leg straight and trying to raise your heel off the floor. This helps strengthen the largest muscle supporting your knee.  °Lying on your back, tighten up the muscles of your buttocks both with the legs straight and with the knee bent at a comfortable angle while keeping your heel on the floor.  ° °SKILLED REHAB INSTRUCTIONS: °If the patient is transferred to a skilled rehab facility following release from the hospital, a list of the current medications will be sent to the facility for the patient to continue.  When discharged from the skilled rehab facility, please have the facility set up the patient's Home Health Physical Therapy prior to being released. Also, the skilled facility will be responsible for providing the patient with their medications at time of release from the facility to include their pain medication and their blood thinner medication. If the patient is still at the rehab facility at time of the two week follow up appointment, the skilled rehab facility will also need to assist the patient in arranging follow up appointment in our office and any transportation needs. ° °MAKE SURE YOU:  °Understand these instructions.  °Will watch your condition.  °Will get help right away if you are not doing well or get worse. ° °Pick up stool softner and  laxative for home use following surgery while on pain medications. °Do not remove your dressing. °The dressing is waterproof--it is OK to take showers. °Continue to use ice for pain and swelling after surgery. °Do not use any lotions or creams on the incision until instructed by your surgeon. °Total Hip Protocol. ° ° °

## 2019-06-20 NOTE — ED Triage Notes (Signed)
Pt BIBA from home.   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 Denies LOC, denies head or cervical pain, denies taking blood thinners.   Pt received 150 mcg fentanyl total.  Last dose 1045 (50 mcg).

## 2019-06-20 NOTE — Anesthesia Procedure Notes (Signed)
Spinal  Patient location during procedure: OR Start time: 06/20/2019 4:48 PM End time: 06/20/2019 4:58 PM Staffing Performed: anesthesiologist  Anesthesiologist: Heather Roberts, MD Preanesthetic Checklist Completed: patient identified, IV checked, risks and benefits discussed, surgical consent, monitors and equipment checked, pre-op evaluation and timeout performed Spinal Block Patient position: left lateral decubitus Prep: DuraPrep Patient monitoring: cardiac monitor, continuous pulse ox and blood pressure Approach: midline Location: L2-3 Injection technique: single-shot Needle Needle type: Pencan and Quincke  Needle gauge: 22 G Needle length: 9 cm Additional Notes Functioning IV was confirmed and monitors were applied. Sterile prep and drape, including hand hygiene and sterile gloves were used. The patient was positioned and the spine was prepped. The skin was anesthetized with lidocaine.  Free flow of clear CSF was obtained prior to injecting local anesthetic into the CSF.  The spinal needle aspirated freely following injection.  The needle was carefully withdrawn.  The patient tolerated the procedure well.

## 2019-06-20 NOTE — ED Notes (Signed)
Report called to [facility], RN in short stay.  Per Carlyle Basques, once Covid test results, they will come to get pt.

## 2019-06-20 NOTE — Op Note (Signed)
OPERATIVE REPORT  SURGEON: Samson Frederic, MD   ASSISTANT: Georgena Spurling, PA-C.  PREOPERATIVE DIAGNOSIS: Displaced Left femoral neck fracture.   POSTOPERATIVE DIAGNOSIS: Displaced Left femoral neck fracture.   PROCEDURE: Left total hip arthroplasty, anterior approach.   IMPLANTS: Stryker Accolade II stem, size 5, 132 degree neck angle. Stryker Trident II Tritanium [employer], size 50 mm. Stryker X3 liner, size 36 mm, D, neutral. Stryker metal head ball, size 36 -5 mm.  ANESTHESIA:  Spinal  ANTIBIOTICS: 2g ancef.  ESTIMATED BLOOD LOSS:-450 mL    DRAINS: None.  COMPLICATIONS: None   CONDITION: PACU - hemodynamically stable.   BRIEF CLINICAL NOTE: [name] is a 79 y.o. female with a displaced Left femoral neck fracture. The patient was admitted to the hospitalist service and underwent perioperative risk stratification and medical optimization. The risks, benefits, and alternatives to total hip arthroplasty were explained, and the patient elected to proceed.  PROCEDURE IN DETAIL: The patient was taken to the operating room and general anesthesia was induced on the hospital bed.  The patient was then positioned on the Hana table.  All bony prominences were well padded.  The hip was prepped and draped in the normal sterile surgical fashion.  A time-out was called verifying side and site of surgery. Antibiotics were given within 60 minutes of beginning the procedure.  The direct anterior approach to the hip was performed through the Hueter interval.  Lateral femoral circumflex vessels were treated with the Auqumantys. The anterior capsule was exposed and an inverted T capsulotomy was made.  Fracture hematoma was encountered and evacuated. The patient was found to have a comminuted Left subcapital femoral neck fracture.  I freshened the femoral neck cut with a saw.  I removed the femoral neck fragment.  A corkscrew was placed into the head and the head was removed.  This  was passed to the back table and was measured.   Acetabular exposure was achieved, and the pulvinar and labrum were excised. Sequential reaming of the acetabulum was then performed up to a size 49 mm reamer. A 50 mm cup was then opened and impacted into place at approximately 40 degrees of abduction and 20 degrees of anteversion. The final polyethylene liner was impacted into place.    I then gained femoral exposure taking care to protect the abductors and greater trochanter.  This was performed using standard external rotation, extension, and adduction.  The capsule was peeled off the inner aspect of the greater trochanter, taking care to preserve the short external rotators. A cookie cutter was used to enter the femoral canal, and then the femoral canal finder was used to confirm location.  I then sequentially broached up to a size 5.  Calcar planer was used on the femoral neck remnant.  I paced a 132 deg neck and a trial head ball. The hip was reduced.  Leg lengths were checked fluoroscopically.  The hip was dislocated and trial components were removed.  I placed the real stem followed by the real spacer and head ball.  The hip was reduced.  Fluoroscopy was used to confirm component position and leg lengths.  At 90 degrees of external rotation and extension, the hip was stable to an anterior directed force.   The wound was copiously irrigated with Irrisept solution and normal saline using pule lavage.  Marcaine solution was injected into the periarticular soft tissue.  The wound was closed in layers using #1 Vicryl and V-Loc for the fascia, 2-0 Vicryl for  the subcutaneous fat, 2-0 Monocryl for the deep dermal layer, and staples plus glue for the skin.  Once the glue was fully dried, an Aquacell Ag dressing was applied.  The patient was then awakened from anesthesia and transported to the recovery room in stable condition.  Sponge, needle, and instrument counts were correct at the end of the case x2.  The  patient tolerated the procedure well and there were no known complications.  Please note that a surgical assistant was a medical necessity for this procedure to perform it in a safe and expeditious manner. Assistant was necessary to provide appropriate retraction of vital neurovascular structures, to prevent femoral fracture, and to allow for anatomic placement of the prosthesis.

## 2019-06-20 NOTE — ED Notes (Signed)
[name], husband of patient wants an update on his wife, [phone].

## 2019-06-20 NOTE — Anesthesia Postprocedure Evaluation (Signed)
Anesthesia Post Note  Patient: [name]  Procedure(s) Performed: TOTAL HIP ARTHROPLASTY ANTERIOR APPROACH (Left Hip)     Patient location during evaluation: PACU Anesthesia Type: Spinal Level of consciousness: awake and alert Pain management: pain level controlled Vital Signs Assessment: post-procedure vital signs reviewed and stable Respiratory status: spontaneous breathing and respiratory function stable Cardiovascular status: blood pressure returned to baseline and stable Postop Assessment: spinal receding Anesthetic complications: no    Last Vitals:  Vitals:   06/20/19 2028 06/20/19 2030  BP:  (!) 107/55  Pulse: 73 65  Resp: 15 14  Temp: 36.9 C   SpO2: 100% 100%    Last Pain:  Vitals:   06/20/19 2028  TempSrc:   PainSc: 0-No pain    LLE Motor Response: Purposeful movement (06/20/19 2028) LLE Sensation: Full sensation (06/20/19 2028) RLE Motor Response: Purposeful movement (06/20/19 2028) RLE Sensation: Full sensation (06/20/19 2028) L Sensory Level: S1-Sole of foot, small toes (06/20/19 2028) R Sensory Level: S1-Sole of foot, small toes (06/20/19 2028)  [name]

## 2019-06-20 NOTE — ED Notes (Signed)
[name], daughter, wants an update on her mother, [phone].

## 2019-06-20 NOTE — ED Provider Notes (Signed)
[facility] DEPT Provider Note   CSN: [id number] Arrival date & time: 06/20/19  1048     History Chief Complaint  Patient presents with  . Fall  . Hip Pain    left  . Knee Pain    left    [name] is a 79 y.o. female.  [name] is a 79 y.o. female with a history of osteopenia, GERD, and chronic right lower extremity weakness, who presents to the emergency department via EMS after a fall.  Patient states that she was trying to walk with her walker this morning when her right knee gave out, this is a common struggle for the patient.  This caused her to fall on her left side landing on her right knee and hip.  She did not hit her head, no loss of consciousness, denies any pain in her neck or her back.  Denies any pain in her chest or her abdomen.  She reports severe pain on the outer portion of her left hip and left knee.  Left knee has previously been replaced by Dr. Maureen Ralphs, no prior injury or surgery to the left hip.  Denies numbness tingling or weakness in her lower extremities.  Received three 50 mg doses of fentanyl with EMS prior to arrival, states this is starting to wear off and she is complaining of increasing pain, unable to get comfortable or turn off of her right side.        Past Medical History:  Diagnosis Date  . Allergy   . Cataract   . Congenital defect    left arm  . GERD (gastroesophageal reflux disease)   . IBS (irritable bowel syndrome)   . Osteopenia   . 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 Unspecified vitamin D deficiency 2010    Patient Active Problem List   Diagnosis Date Noted  . Unspecified vitamin D deficiency 10/10/2010  . Osteopenia   . Gastroenteritis 09/13/2010  . UTI (lower urinary tract infection) 09/13/2010    Past Surgical History:  Procedure Laterality Date  . CATARACT EXTRACTION     both eyes  . CHOLECYSTECTOMY      . 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 Maureen Ralphs)     OB History    Gravida  8   Para  8   Term      Preterm  1   AB      Living  6     SAB      TAB      Ectopic      Multiple      Live Births           Obstetric Comments  1 was pre-term and lived <2 hrs; another child died at 65 months, 6 days (lungs didn't develop properly)        Family History  Problem Relation Age of Onset  . Stroke Mother   . Vision loss Mother   . Vision loss Father   . Stroke Father   . Cancer Sister 22       breast cancer  . Vision loss Brother   . Diabetes Neg Hx   . Heart disease Neg Hx     Social History   Tobacco Use  . Smoking status: Never Smoker  . Smokeless tobacco: Never Used  Substance Use Topics  . Alcohol use: No  .  Drug use: No    Home Medications Prior to Admission medications   Medication Sig Start Date End Date Taking? Authorizing Provider  aspirin 81 MG EC tablet Take 81 mg by mouth every morning.     [provider]  ondansetron (ZOFRAN ODT) 4 MG disintegrating tablet Take 1 tablet (4 mg total) by mouth every 8 (eight) hours as needed for nausea or vomiting. 02/12/19   Dahlia Byes A, NP    Allergies    Sulfa drugs cross reactors  Review of Systems   Review of Systems  Constitutional: Negative for chills and fever.  Respiratory: Negative for cough and shortness of breath.   Cardiovascular: Negative for chest pain.  Gastrointestinal: Negative for abdominal pain.  Musculoskeletal: Positive for arthralgias and joint swelling.  Skin: Negative for color change and wound.  Neurological: Negative for weakness and numbness.  All other systems reviewed and are negative.   Physical Exam Updated Vital Signs BP 136/67 (BP Location: Right Arm)   Pulse (!) 55   Temp 97.9 F (36.6 C) (Oral)   Resp 17   SpO2 98%   Physical Exam Vitals and nursing note reviewed.  Constitutional:      General: She is not in acute distress.     Appearance: She is well-developed. She is not diaphoretic.     Comments: Patient appears uncomfortable, laying on right side, but in no distress  HENT:     Head: Normocephalic and atraumatic.     Comments: No evident head trauma on exam Eyes:     General:        Right eye: No discharge.        Left eye: No discharge.  Neck:     Comments: No midline C-spine tenderness Cardiovascular:     Rate and Rhythm: Normal rate and regular rhythm.     Pulses: Normal pulses.          Radial pulses are 2+ on the right side and 2+ on the left side.       Dorsalis pedis pulses are 2+ on the right side and 2+ on the left side.     Heart sounds: Normal heart sounds. No murmur. No friction rub. No gallop.   Pulmonary:     Effort: Pulmonary effort is normal. No respiratory distress.     Breath sounds: Normal breath sounds. No wheezing or rales.  Abdominal:     General: Bowel sounds are normal. There is no distension.     Palpations: Abdomen is soft. There is no mass.     Tenderness: There is no abdominal tenderness. There is no guarding.  Musculoskeletal:        General: No deformity.     Cervical back: Neck supple.     Comments: Focal tenderness over the lateral left knee with swelling noted, unable to flex and extend due to pain.  Patient also has tenderness over the lateral left hip with swelling and slight ecchymosis.  No obvious shortening or rotation but patient is laying on her side making this more difficult to assess. No midline thoracic or lumbar spine tenderness All other joints supple and easily movable, all compartments soft.  Skin:    General: Skin is warm and dry.     Capillary Refill: Capillary refill takes less than 2 seconds.  Neurological:     Mental Status: She is alert.     Coordination: Coordination normal.     Comments: Speech is clear, able to follow commands CN III-XII  intact 5/5 strength in bilateral upper extremities, strength in the left lower extremity is difficult to  assess due to pain and injury.  Normal strength in the right lower extremity. Sensation normal to light and sharp touch Moves extremities without ataxia, coordination intact     ED Results / Procedures / Treatments   Labs (all labs ordered are listed, but only abnormal results are displayed) Labs Reviewed  BASIC METABOLIC PANEL - Abnormal; Notable for the following components:      Result Value   Glucose, Bld 175 (*)    Creatinine, Ser 1.04 (*)    Calcium 8.6 (*)    GFR calc non Af Amer 50 (*)    GFR calc Af Amer 58 (*)    All other components within normal limits  POCT I-STAT, CHEM 8 - Abnormal; Notable for the following components:   Glucose, Bld 106 (*)    Hemoglobin 9.9 (*)    HCT 29.0 (*)    All other components within normal limits  RESPIRATORY PANEL BY RT PCR (FLU A&B, COVID)  CBC WITH DIFFERENTIAL/PLATELET  PROTIME-INR  TSH  CBC  BASIC METABOLIC PANEL  MAGNESIUM  VITAMIN D 25 HYDROXY (VIT D DEFICIENCY, FRACTURES)  TYPE AND SCREEN  ABO/RH    EKG None  Radiology CT Knee Left Wo Contrast  Result Date: 06/20/2019 CLINICAL DATA:  Left knee pain after a fall. EXAM: CT OF THE LEFT KNEE WITHOUT CONTRAST TECHNIQUE: Multidetector CT imaging of the left knee was performed according to the standard protocol. Multiplanar CT image reconstructions were also generated. COMPARISON:  Radiographs dated 06/20/2019 and 11/03/2013 FINDINGS: Bones/Joint/Cartilage There is no visible fracture of the left knee. The components of the total knee prosthesis demonstrate no evidence of loosening. 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 The detail of the patellar component of the prosthesis is partially obscured by the metallic artifact from the distal femoral component. No appreciable joint effusion. Muscles and Tendons Negative. Soft tissues Negative. IMPRESSION: No acute abnormality of the left knee. 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 Electronically Signed   By: Francene Boyers M.D.   On: 06/20/2019 13:34   CT Hip Left Wo Contrast  Result Date: 06/20/2019 CLINICAL DATA:  The patient suffered a fall today when her left knee gave way. Left hip pain. Initial encounter. EXAM: CT OF THE LEFT HIP WITHOUT CONTRAST TECHNIQUE: Multidetector CT imaging of the left hip was performed according to the standard protocol. Multiplanar CT image reconstructions were also generated. COMPARISON:  Plain films left hip earlier today. FINDINGS: Bones/Joint/Cartilage The patient has an acute subcapital fracture of the left hip with mild impaction at the superior margin of the fracture. The femoral head is located. No other fracture is identified. No evidence of arthropathy about the hip. No focal bone lesion. Ligaments Suboptimally assessed by CT. Muscles and Tendons Intact and normal in appearance. Soft tissues Imaged intrapelvic contents demonstrate no acute abnormality. IMPRESSION: Acute subcapital fracture left hip. Electronically Signed   By: Drusilla Kanner M.D.   On: 06/20/2019 13:25   DG Knee Complete 4 Views Left  Result Date: 06/20/2019 CLINICAL DATA:  Fall EXAM: LEFT KNEE - COMPLETE 4+ VIEW COMPARISON:  11/03/2013 FINDINGS: Status post left knee total arthroplasty. There is redemonstrated fragmentation and distraction of the left patella, unchanged in appearance when compared to examination dated 11/03/2013. No evidence of perihardware fracture or loosening. Soft tissues are unremarkable. IMPRESSION: Status post left  knee total arthroplasty. There is redemonstrated fragmentation and distraction of the left patella, unchanged in appearance when compared to examination dated 11/03/2013. No evidence of perihardware fracture or loosening. Electronically Signed   By: Lauralyn Primes M.D.   On: 06/20/2019 12:02   DG Hip Unilat With Pelvis 2-3 Views Left  Result Date: 06/20/2019 CLINICAL DATA:  Left hip pain  secondary to a fall. EXAM: DG HIP (WITH OR WITHOUT PELVIS) 2-3V LEFT COMPARISON:  Pelvic radiograph dated 11/07/2010. FINDINGS: There is no dislocation. The patient was unable to be positioned for standard projections. There is no discrete fracture of the pelvis or of the hips. However, I do not feel that the right femoral neck is adequately assessed on the available images. CT scan of hip is recommended for further evaluation. IMPRESSION: No visible fractures of the pelvis or hips. However, the patient was unable to be positioned for standard projections. Recommend CT scan of the hips for further evaluation because I cannot completely clear the left femoral neck. Electronically Signed   By: Francene Boyers M.D.   On: 06/20/2019 13:00    Procedures Procedures (including critical care time)  Medications Ordered in ED Medications - No data to display  ED Course  I have reviewed the triage vital signs and the nursing notes.  Pertinent labs & imaging results that were available during my care of the patient were reviewed by me and considered in my medical decision making (see chart for details).    MDM Rules/Calculators/A&P                      79 year old female presents after fall landing on her left hip and knee with pain and swelling noted to both.  Did not hit her head and denies any neck or back pain.  No injuries to the chest or abdomen.  On exam there is swelling and tenderness to the left hip and knee, plain films ordered by nursing staff upon arrival which do not show obvious fracture, but due to positioning these images are limited.  We will get CTs of the left hip and knee, patient has had a previous knee replacement.  We will also get basic labs.  Fentanyl given for pain.  Left knee CT does not show evidence of fracture, hardware from previous replacement is intact chronic destruction and fragmentation of the patella is unchanged.  CT of the left hip shows acute subcapital fracture, will  discuss with orthopedics.  Case discussed with Dr. Linna Caprice who will see patient and plan to take her to the OR as soon as she has surgical clearance, requests rapid Covid test and medicine admission.  Added on EKG and type and screen to patient's work-up.  Will place medicine admission consult.  Case discussed with Dr. Eric [place] with [facility] who will see and admit the patient.  I have also called and updated the patient's daughter and husband regarding plan for admission and surgery.  Final Clinical Impression(s) / ED Diagnoses Final diagnoses:  Closed subcapital fracture of femur, left, initial encounter [facility])  Fall, initial encounter  Pain and swelling of left knee    Rx / DC Orders ED Discharge Orders    None       Legrand Rams 06/20/19 2219    Bethann Berkshire, MD 06/22/19 [phone]

## 2019-06-20 NOTE — Anesthesia Procedure Notes (Signed)
Date/Time: 06/20/2019 4:43 PM Performed by: Minerva Ends, CRNA Pre-anesthesia Checklist: Patient identified, Emergency Drugs available, Suction available, Patient being monitored and Timeout performed Patient Re-evaluated:Patient Re-evaluated prior to induction Oxygen Delivery Method: Simple face mask Placement Confirmation: breath sounds checked- equal and bilateral and positive ETCO2 Dental Injury: Teeth and Oropharynx as per pre-operative assessment

## 2019-06-21 ENCOUNTER — Encounter: Payer: Self-pay | Admitting: *Deleted

## 2019-06-21 DIAGNOSIS — S72002A Fracture of unspecified part of neck of left femur, initial encounter for closed fracture: Secondary | ICD-10-CM

## 2019-06-21 LAB — BASIC METABOLIC PANEL
Anion gap: 7 (ref 5–15)
BUN: 17 mg/dL (ref 8–23)
CO2: 26 mmol/L (ref 22–32)
Calcium: 7.9 mg/dL — ABNORMAL LOW (ref 8.9–10.3)
Chloride: 107 mmol/L (ref 98–111)
Creatinine, Ser: 1.09 mg/dL — ABNORMAL HIGH (ref 0.44–1.00)
GFR calc Af Amer: 55 mL/min — ABNORMAL LOW (ref >60)
GFR calc non Af Amer: 48 mL/min — ABNORMAL LOW (ref >60)
Glucose, Bld: 111 mg/dL — ABNORMAL HIGH (ref 70–99)
Potassium: 4 mmol/L (ref 3.5–5.1)
Sodium: 140 mmol/L (ref 135–145)

## 2019-06-21 LAB — MAGNESIUM: Magnesium: 1.9 mg/dL (ref 1.7–2.4)

## 2019-06-21 LAB — CBC
HCT: 24 % — ABNORMAL LOW (ref 36.0–46.0)
Hemoglobin: 7.9 g/dL — ABNORMAL LOW (ref 12.0–15.0)
MCH: 31.2 pg (ref 26.0–34.0)
MCHC: 32.9 g/dL (ref 30.0–36.0)
MCV: 94.9 fL (ref 80.0–100.0)
Platelets: 91 10*3/uL — ABNORMAL LOW (ref 150–400)
RBC: 2.53 MIL/uL — ABNORMAL LOW (ref 3.87–5.11)
RDW: 12.6 % (ref 11.5–15.5)
WBC: 6.7 10*3/uL (ref 4.0–10.5)
nRBC: 0 % (ref 0.0–0.2)

## 2019-06-21 LAB — ABO/RH: ABO/RH(D): A NEG

## 2019-06-21 MED ORDER — SODIUM CHLORIDE 0.9 % IV SOLN: INTRAVENOUS | Status: DC

## 2019-06-21 MED ORDER — ADULT MULTIVITAMIN W/MINERALS CH
1.0000 | ORAL_TABLET | Freq: Every day | ORAL | Status: DC
  Administered 2019-06-21 – 2019-06-23 (×3): 1 via ORAL
  Filled 2019-06-21 (×3): qty 1

## 2019-06-21 MED ORDER — ENSURE ENLIVE PO LIQD
237.0000 mL | ORAL | Status: DC
  Administered 2019-06-23: 237 mL via ORAL

## 2019-06-21 MED ORDER — AMLODIPINE BESYLATE 5 MG PO TABS
2.5000 mg | ORAL_TABLET | Freq: Every day | ORAL | Status: DC
  Administered 2019-06-22 – 2019-06-23 (×2): 2.5 mg via ORAL
  Filled 2019-06-21 (×2): qty 1

## 2019-06-21 MED ORDER — HYDROCODONE-ACETAMINOPHEN 5-325 MG PO TABS: 1.0000 | ORAL_TABLET | ORAL | 0 refills | Status: DC | PRN

## 2019-06-21 MED ORDER — ASPIRIN 81 MG PO CHEW: 81.0000 mg | CHEWABLE_TABLET | Freq: Two times a day (BID) | ORAL | 0 refills | Status: AC

## 2019-06-21 NOTE — Progress Notes (Signed)
    Subjective:  Patient reports pain as mild to moderate.  Denies CP/SOB. C/o N/V this am  Objective:   VITALS:   Vitals:   06/21/19 0459 06/21/19 0500 06/21/19 1248 06/21/19 1308  BP: (!) 118/55  (!) 102/46   Pulse: 68   95  Resp: 16     Temp: 98.2 F (36.8 C)   98 F (36.7 C)  TempSrc: Oral   Oral  SpO2: 100%   91%  Weight:  77.1 kg    Height:        NAD ABD soft Sensation intact distally Intact pulses distally Dorsiflexion/Plantar flexion intact Incision: dressing C/D/I Compartment soft   Lab Results  Component Value Date   WBC 6.7 06/21/2019   HGB 7.9 (L) 06/21/2019   HCT 24.0 (L) 06/21/2019   MCV 94.9 06/21/2019   PLT 91 (L) 06/21/2019   BMET    Component Value Date/Time   NA 140 06/21/2019 0339   K 4.0 06/21/2019 0339   CL 107 06/21/2019 0339   CO2 26 06/21/2019 0339   GLUCOSE 111 (H) 06/21/2019 0339   BUN 17 06/21/2019 0339   CREATININE 1.09 (H) 06/21/2019 0339   CREATININE 0.76 05/25/2011 1219   CALCIUM 7.9 (L) 06/21/2019 0339   GFRNONAA 48 (L) 06/21/2019 0339   GFRAA 55 (L) 06/21/2019 0339     Assessment/Plan: 1 Day Post-Op   Principal Problem:   Hip fracture (HCC) Active Problems:   GERD (gastroesophageal reflux disease)   Benign essential HTN   WBAT with walker DVT ppx: Lovenox in house --> d/c on ASA 81 mg PO BID x6 weeks, SCDs, TEDS PO pain control PT/OT N/V: supportive care Dispo: D/C planning   Iline Oven Ziyad Dyar 06/21/2019, 2:05 PM   Samson Frederic, MD [phone] [facility] now [facility]  [facility] [address], [address], [place], [place] [address] Phone: [phone] [email or web address] Facebook  [employer]

## 2019-06-21 NOTE — Progress Notes (Signed)
Hospitalist progress note   Patient from home, Patient going unclear, Dispo pending at this time on therapy recommendations and on orthopedics input  [name] [id number] DOB: 1940-07-31 DOA: 06/20/2019  PCP: [name], MD   Narrative:  79 year old female HTN reflux Covid 01/2019 admitted with left acute subcapital hip fracture after accidental fall Underwent surgical repair 1/26   Data Reviewed:  BUN/creatinine 17/1.09 Hemoglobin 9.977.9 platelets 91 WBC 6.7 TSH 0.66   Assessment & Plan:  Left hip fracture Status post repair 1/26, pain control, anticoagulation, mobilization with weightbearing precautions as well as further management from hip fracture perspective as per orthopedic surgery Tylenol appears to be first choice, Norco at variable dosing second and third choice Sparingly use IV morphine and slightly somnolent Mild AKI BUN/creatinine up from baseline from admission-will continue IV saline at 50 cc/h overnight and monitor trends Apparently received albumin Intra-Op HTN Continue amlodipine 5 mg daily and monitor trends Reflux On high-dose of pantoprazole which is known to have deleterious effects on B12 and calcium-may need a lower dose  Called Husband and gave a brief update as to POC [phone]  Subjective: Doing well but little groggy received pain medications earlier No chest pain no fever no chills No nausea no vomiting at this time Tolerating liquids at the bedside, telling me that she can also Consultants:   orthopedics  Objective: Vitals:   06/20/19 2106 06/21/19 0029 06/21/19 0459 06/21/19 0500  BP: (!) 103/49 (!) 109/51 (!) 118/55   Pulse: 72 65 68   Resp: 15 18 16    Temp: 97.7 F (36.5 C) 98.4 F (36.9 C) 98.2 F (36.8 C)   TempSrc: Oral Oral Oral   SpO2: 97% 99% 100%   Weight: 76.2 kg   77.1 kg  Height: 5\' 3"  (1.6 m)       Intake/Output Summary (Last 24 hours) at 06/21/2019 0726 Last data filed at 06/21/2019 0510 Gross per 24 hour   Intake 2391.61 ml  Output 1425 ml  Net 966.61 ml   Filed Weights   06/20/19 1622 06/20/19 2106 06/21/19 0500  Weight: 74.4 kg 76.2 kg 77.1 kg    Examination: Awake alert coherent nod distress cta hb no added sound no rales no rhonchi abd soft nt nd no rebound no guard Pain limits SLR on the L side Smile symm Power 5/5   Scheduled Meds: . amLODipine  5 mg Oral Daily  . docusate sodium  100 mg Oral BID  . enoxaparin (LOVENOX) injection  40 mg Subcutaneous Q24H  . pantoprazole  80 mg Oral Q1200  . senna  1 tablet Oral BID   Continuous Infusions: . methocarbamol (ROBAXIN) IV 500 mg (06/20/19 1940)     LOS: 1 day   Time spent: 60 06/22/19, MD [facility]  06/21/2019, 7:26 AM

## 2019-06-21 NOTE — Progress Notes (Signed)
Rehab Admissions Coordinator Note:  Per PT recommendation, patient was screened by Stephania Fragmin for appropriateness for an Inpatient Acute Rehab Consult.  At this time, pt does not have the medical necessity for an inpatient rehab stay and we are recommending follow up with therapy in a lower level of care. Stephania Fragmin 06/21/2019, 1:40 PM  I can be reached at [phone].

## 2019-06-21 NOTE — TOC Initial Note (Signed)
Transition of Care [facility]) - Initial/Assessment Note    Patient Details  Name: [name] MRN: [id number] Date of Birth: 1941/02/16  Transition of Care [facility]) CM/SW Contact:    Lanier Clam, RN Phone Number: 06/21/2019, 10:54 AM  Clinical Narrative: Await PT cons,& recc. Follow for d/c needs.                  Expected Discharge Plan: Skilled Nursing Facility Barriers to Discharge: Continued Medical Work up   Patient Goals and CMS Choice        Expected Discharge Plan and Services Expected Discharge Plan: Skilled Nursing Facility   Discharge Planning Services: CM Consult                                          Prior Living Arrangements/Services   Lives with:: Spouse Patient language and need for interpreter reviewed:: Yes        Need for Family Participation in Patient Care: No (Comment) Care giver support system in place?: Yes (comment)   Criminal Activity/Legal Involvement Pertinent to Current Situation/Hospitalization: No - Comment as needed  Activities of Daily Living Home Assistive Devices/Equipment: Grab bars in shower, Hand-held shower hose, Walker (specify type), Dentures (specify type), Eyeglasses, Shower chair with back, Bedside commode/3-in-1, Reacher(handicap bathroom, 4 wheeled walker, front wheeled walker, upper denture, reading glasses) ADL Screening (condition at time of admission) Patient's cognitive ability adequate to safely complete daily activities?: Yes Is the patient deaf or have difficulty hearing?: No Does the patient have difficulty seeing, even when wearing glasses/contacts?: No Does the patient have difficulty concentrating, remembering, or making decisions?: No Patient able to express need for assistance with ADLs?: Yes Does the patient have difficulty dressing or bathing?: Yes Independently performs ADLs?: No Communication: Independent Dressing (OT): Needs assistance Is this a change from baseline?: Change from baseline,  expected to last >3 days Grooming: Needs assistance Is this a change from baseline?: Change from baseline, expected to last >3 days Feeding: Needs assistance Is this a change from baseline?: Change from baseline, expected to last >3 days Bathing: Needs assistance Is this a change from baseline?: Change from baseline, expected to last >3 days Toileting: Needs assistance Is this a change from baseline?: Change from baseline, expected to last >3days In/Out Bed: Needs assistance Is this a change from baseline?: Change from baseline, expected to last >3 days Walks in Home: Needs assistance Is this a change from baseline?: Change from baseline, expected to last >3 days Does the patient have difficulty walking or climbing stairs?: Yes 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) Weakness of Arms/Hands: None  Permission Sought/Granted Permission sought to share information with : Case Manager Permission granted to share information with : Yes, Verbal Permission Granted  Share Information with NAME: Case Manager     Permission granted to share info w Relationship: Willis(spouse)[phone]     Emotional Assessment Appearance:: Appears stated age Attitude/Demeanor/Rapport: Gracious Affect (typically observed): Accepting Orientation: : Oriented to Self, Oriented to Place, Oriented to  Time, Oriented to Situation Alcohol / Substance Use: Not Applicable Psych Involvement: No (comment)  Admission diagnosis:  Hip fracture [facility]) [S72.009A] Surgery, elective [Z41.9] Fall [W19.XXXA] Fall, initial encounter [id number]] Closed subcapital fracture of femur, left, initial encounter (HCC) [S72.012A] Pain and swelling of left knee [Y81.448, M25.462] Patient Active Problem List   Diagnosis Date Noted  . Hip fracture (HCC)  06/20/2019  . GERD (gastroesophageal reflux disease) 06/20/2019  . Benign essential HTN 06/20/2019  . Unspecified vitamin D deficiency 10/10/2010  . Osteopenia   .  Gastroenteritis 09/13/2010  . UTI (lower urinary tract infection) 09/13/2010   PCP:  Lavone Orn, MD Pharmacy:   [place], [place] - [address] [place] [address] Phone: [phone] Fax: [phone]     Social Determinants of Health (SDOH) Interventions    Readmission Risk Interventions No flowsheet data found.

## 2019-06-21 NOTE — Evaluation (Signed)
Physical Therapy Evaluation Patient Details Name: [name] MRN: [id number] DOB: 1941-05-10 Today's Date: 06/21/2019   History of Present Illness  [name] is a 79 y.o. female with medical history significant of hypertension, GERD and history of Covid-19 viral infection in September 2020 who presents to the ER following fall with subsequent left hip pain.  Patient states was walking with her walker and tripped and fell and landed on her left side. Patient is now s/p Lt THA anterior approach on 06/20/19 and is WBAT on Lt LE.    Clinical Impression  [name] is 79 y.o. female admitted with above HPI and POD 1 s/p Lt THA anterior approach. Patient is currently limited by functional impairments below (see PT problem list). Patient lives with her husband and daughter and is modified independent at baseline with use of Rollator since December 2020 after injuring Rt foot. Patient was limited by some weakness, dizziness and 1 bout of emesis during session. She was able to transfer to [facility] and was motivated to participate in therapeutic exercises after returning to supine. Patient will benefit from continued skilled PT interventions to address impairments and progress independence with mobility, recommending CIR and 24/7 assist. Acute PT will follow and progress as able.     Follow Up Recommendations CIR;Supervision/Assistance - 24 hour    Equipment Recommendations  None recommended by PT    Recommendations for Other Services       Precautions / Restrictions Precautions Precautions: Fall Restrictions Weight Bearing Restrictions: No      Mobility  Bed Mobility Overal bed mobility: Needs Assistance Bed Mobility: Supine to Sit;Sit to Supine     Supine to sit: Min assist;HOB elevated Sit to supine: Min assist;HOB elevated   General bed mobility comments: cues for sequencing roll to Rt side to begin supine<>sit transfer and pt initiated reaching with Lt UE and walking bil LE's to  EOB. Assist required to bring LE's off EOB completely and to raise trunk upright. Pt with bout of emesis upon sitting EOB (BP noted to be 124/46 and 124/50 subsequently) pt reported some "wooziness" in sitting but improved after emesis.  Transfers Overall transfer level: Needs assistance Equipment used: Rolling walker (2 wheeled) Transfers: Sit to/from [employer] Sit to Stand: Min assist Stand pivot transfers: Mod assist;Min assist       General transfer comment: cues for safe hand placement for power up, pt limited by Lt UE strength and ROM impairments. Pt able to tolerate WBAT on Lt LE during rise to stand. Assist to steady upon rising. Cues for step pattern in RW and assist to manage RW position for stand step transfer to [facility].  Ambulation/Gait      General Gait Details: deferred due to very low diastolic BP and "wozziness"/dizziness after sitting up prolonged period  Stairs       Wheelchair Mobility    Modified Rankin (Stroke Patients Only)       Balance Overall balance assessment: Needs assistance Sitting-balance support: Single extremity supported;Feet supported Sitting balance-Leahy Scale: Fair     Standing balance support: During functional activity;Bilateral upper extremity supported Standing balance-Leahy Scale: Poor            Pertinent Vitals/Pain Pain Assessment: Faces Faces Pain Scale: Hurts little more Pain Location: Lt hip Pain Intervention(s): Limited activity within patient's tolerance;Monitored during session;Repositioned    Home Living Family/patient expects to be discharged to:: Private residence Living Arrangements: Spouse/significant other;Children Available Help at Discharge: Available 24 hours/day;Family(husband during  the day and daughter is available in evenings. she lives with them.) Type of Home: House Home Access: Stairs to enter;Ramped entrance   [employer] of Steps: pt has a ramp to get into her home at  front. she has 1 single step inside house from kitchen to the den (no hand rails) Home Layout: One level Home Equipment: Walker - 4 wheels;Walker - 2 wheels;Bedside commode;Tub bench      Prior Function Level of Independence: Independent with assistive device(s)         Comments: pt had a fall in Dec. 202 injuring Rt foot and has been using rollator for gait since that time. prior to that pt was mobilizing in home with no device at times. occasionally she requires some assist from her husband for bathing and dressing due to weakness and Lt UE injury that occured at birth.     Hand Dominance   Dominant Hand: Right    Extremity/Trunk Assessment   Upper Extremity Assessment Upper Extremity Assessment: Defer to OT evaluation    Lower Extremity Assessment Lower Extremity Assessment: Generalized weakness    Cervical / Trunk Assessment Cervical / Trunk Assessment: Kyphotic  Communication   Communication: No difficulties  Cognition Arousal/Alertness: Awake/alert Behavior During Therapy: WFL for tasks assessed/performed Overall Cognitive Status: Within Functional Limits for tasks assessed           General Comments      Exercises Total Joint Exercises Ankle Circles/Pumps: AROM;Supine;Both;15 reps Quad Sets: AROM;10 reps;Supine;Left Heel Slides: AROM;10 reps;Supine;Left   Assessment/Plan    PT Assessment Patient needs continued PT services  PT Problem List Decreased strength;Decreased balance;Decreased mobility;Decreased range of motion;Decreased activity tolerance;Decreased coordination;Decreased knowledge of use of DME       PT Treatment Interventions DME instruction;Functional mobility training;Balance training;Patient/family education;Therapeutic activities;Gait training;Stair training;Therapeutic exercise    PT Goals (Current goals can be found in the Care Plan section)  Acute Rehab PT Goals Patient Stated Goal: to get to IP Rehab and get stronger to get back to  normal prior level of independence PT Goal Formulation: With patient Time For Goal Achievement: 07/05/19 Potential to Achieve Goals: Good    Frequency 7X/week    AM-PAC PT "6 Clicks" Mobility  Outcome Measure Help needed turning from your back to your side while in a flat bed without using bedrails?: A Little Help needed moving from lying on your back to sitting on the side of a flat bed without using bedrails?: A Little Help needed moving to and from a bed to a chair (including a wheelchair)?: A Little Help needed standing up from a chair using your arms (e.g., wheelchair or bedside chair)?: A Little Help needed to walk in hospital room?: A Lot Help needed climbing 3-5 steps with a railing? : A Lot 6 Click Score: 16    End of Session Equipment Utilized During Treatment: Gait belt Activity Tolerance: Patient tolerated treatment well Patient left: in bed;with call bell/phone within reach;with bed alarm set;with family/visitor present Nurse Communication: Mobility status PT Visit Diagnosis: Unsteadiness on feet (R26.81);Muscle weakness (generalized) (M62.81);Difficulty in walking, not elsewhere classified (R26.2);Other abnormalities of gait and mobility (R26.89);History of falling (Z91.81);Pain Pain - Right/Left: Left Pain - part of body: Hip    Time: 8546-2703 PT Time Calculation (min) (ACUTE ONLY): 50 min   Charges:   PT Evaluation $PT Eval Moderate Complexity: 1 Mod PT Treatments $Therapeutic Exercise: 8-22 mins $Therapeutic Activity: 8-22 mins        Verner Mould, DPT Physical Therapist  with [facility] [phone]  06/21/2019 1:06 PM

## 2019-06-21 NOTE — Progress Notes (Signed)
Initial Nutrition Assessment  RD working remotely.   DOCUMENTATION CODES:   Not applicable  INTERVENTION:  - will order Ensure Enlive once/day, each supplement provides 350 kcal and 20 grams of protein. - will order Magic Cup with dinner meals, each supplement provides 290 kcal and 9 grams of protein. - will order daily multivitamin with minerals.   NUTRITION DIAGNOSIS:   Increased nutrient needs related to post-op healing as evidenced by estimated needs.  GOAL:   Patient will meet greater than or equal to 90% of their needs  MONITOR:   PO intake, Supplement acceptance, Labs, Weight trends  REASON FOR ASSESSMENT:   Consult Hip fracture protocol  ASSESSMENT:   79 y.o. female with medical history of HTN, GERD, and history of Covid-19 viral infection in 01/2019. She presented to the ED following a fall with subsequent L hip pain. She was walking with her walker, tripped, fell, and landed on her L side.  No loss of consciousness, no other injuries sustained.  Diet advanced from NPO to Regular yesterday at 2110. No intakes documented since that time. Per chart review, weight on 1/26 was 168 lb and weight today documented as 170 lb; likely fluid gains from surgery. PTA, the most recently documented weight was on 11/02/13 was 169 lb.   Patient is POD #1 L hip arthroplasty.   Per notes: - mild AKI - no N/V and tolerating liquids this AM   Labs reviewed; creatinine: 1.09 mg/dl, Ca: 7.9 mg/dl, GFR: 48 ml/min. Medications reviewed; 12.5 g albumin x1 dose 1/26, 100 mg colace BID, 80 mg oral protonix/day, 1 tablet senokot BID.  IVF; NS @ 50 ml/hr.     NUTRITION - FOCUSED PHYSICAL EXAM:  unable to complete at this time.   Diet Order:   Diet Order            Diet regular Room service appropriate? Yes; Fluid consistency: Thin  Diet effective now              EDUCATION NEEDS:   No education needs have been identified at this time  Skin:  Skin Assessment: Skin Integrity  Issues: Skin Integrity Issues:: Incisions Incisions: L hip (1/26)  Last BM:  PTA/unknown  Height:   Ht Readings from Last 1 Encounters:  06/20/19 5\' 3"  (1.6 m)    Weight:   Wt Readings from Last 1 Encounters:  06/21/19 77.1 kg    Ideal Body Weight:  52.3 kg  BMI:  Body mass index is 30.11 kg/m.  Estimated Nutritional Needs:   Kcal:  1850-2005 kcal  Protein:  85-95 grams  Fluid:  >/= 1.9 L/day     06/23/19, MS, RD, LDN, [facility] Inpatient Clinical Dietitian Pager # [phone] After hours/weekend pager # [phone]

## 2019-06-21 NOTE — Progress Notes (Signed)
OT Cancellation Note  Patient Details Name: [name] MRN: [id number] DOB: 1940/06/06   Cancelled Treatment:    Reason Eval/Treat Not Completed: Other (comment). Per nursing, pt just finished working with PT and had nausea and decreased BP. Will either check back later today or tomorrow.  Ritter Helsley 06/21/2019, 12:56 PM  Roxana Hires, OTR/L Acute Rehabilitation Services 06/21/2019

## 2019-06-21 NOTE — TOC Progression Note (Signed)
Transition of Care [facility]) - Progression Note    Patient Details  Name: [name] MRN: [id number] Date of Birth: 1940/10/29  Transition of Care [facility]) CM/SW Contact  Kensi Karr, Olegario Messier, RN Phone Number: 06/21/2019, 1:02 PM  Clinical Narrative:  PT recc CIR-CIR coordinator following-await assessment.     Expected Discharge Plan: IP Rehab Facility Barriers to Discharge: Continued Medical Work up  Expected Discharge Plan and Services Expected Discharge Plan: IP Rehab Facility   Discharge Planning Services: CM Consult                                           Social Determinants of Health (SDOH) Interventions    Readmission Risk Interventions No flowsheet data found.

## 2019-06-22 DIAGNOSIS — S72002A Fracture of unspecified part of neck of left femur, initial encounter for closed fracture: Secondary | ICD-10-CM

## 2019-06-22 LAB — CBC
HCT: 25 % — ABNORMAL LOW (ref 36.0–46.0)
Hemoglobin: 8.1 g/dL — ABNORMAL LOW (ref 12.0–15.0)
MCH: 31.2 pg (ref 26.0–34.0)
MCHC: 32.4 g/dL (ref 30.0–36.0)
MCV: 96.2 fL (ref 80.0–100.0)
Platelets: 88 10*3/uL — ABNORMAL LOW (ref 150–400)
RBC: 2.6 MIL/uL — ABNORMAL LOW (ref 3.87–5.11)
RDW: 12.6 % (ref 11.5–15.5)
WBC: 6.7 10*3/uL (ref 4.0–10.5)
nRBC: 0 % (ref 0.0–0.2)

## 2019-06-22 LAB — BASIC METABOLIC PANEL
Anion gap: 5 (ref 5–15)
BUN: 16 mg/dL (ref 8–23)
CO2: 25 mmol/L (ref 22–32)
Calcium: 7.9 mg/dL — ABNORMAL LOW (ref 8.9–10.3)
Chloride: 106 mmol/L (ref 98–111)
Creatinine, Ser: 1.12 mg/dL — ABNORMAL HIGH (ref 0.44–1.00)
GFR calc Af Amer: 53 mL/min — ABNORMAL LOW (ref >60)
GFR calc non Af Amer: 46 mL/min — ABNORMAL LOW (ref >60)
Glucose, Bld: 112 mg/dL — ABNORMAL HIGH (ref 70–99)
Potassium: 3.7 mmol/L (ref 3.5–5.1)
Sodium: 136 mmol/L (ref 135–145)

## 2019-06-22 NOTE — TOC Progression Note (Signed)
Transition of Care [facility]) - Progression Note    Patient Details  Name: NAKEYIA MENDEN MRN: [id number] Date of Birth: November 09, 1940  Transition of Care [facility]) CM/SW Contact  Addaline Peplinski, Olegario Messier, RN Phone Number: 06/22/2019, 1:33 PM  Clinical Narrative:awaiting OT eval to recc SNF if appropriate.Then wil fax to SNF.       Expected Discharge Plan: Skilled Nursing Facility Barriers to Discharge: Continued Medical Work up  Expected Discharge Plan and Services Expected Discharge Plan: Skilled Nursing Facility   Discharge Planning Services: CM Consult                                           Social Determinants of Health (SDOH) Interventions    Readmission Risk Interventions No flowsheet data found.

## 2019-06-22 NOTE — Progress Notes (Signed)
Physical Therapy Treatment Patient Details Name: [name] MRN: [id number] DOB: 02/04/1941 Today's Date: 06/22/2019    History of Present Illness [name] is a 79 y.o. female with medical history significant of hypertension, GERD and history of Covid-19 viral infection in September 2020 who presents to the ER following fall with subsequent left hip pain.  Patient states was walking with her walker and tripped and fell and landed on her left side. Patient is now s/p Lt THA anterior approach on 06/20/19 and is WBAT on Lt LE.    PT Comments    POD # 2 pm session General transfer comment: 25% VC's on proper hand placement and increased time.  Pt was able to transfer from recliner to [facility] then back to recliner.  Then out of recliner to amb. General Gait Details: pt tolerated an increased distance.  Slow but steady gait.  Forwad flexed posture.  Has spouse assist as well holding to gait belt. General bed mobility comments: assisted B LE up onto bed to decrease pain/effort Pt will need ST Rehab at SNF prior to returning home.    Follow Up Recommendations  SNF     Equipment Recommendations  None recommended by PT    Recommendations for Other Services       Precautions / Restrictions Precautions Precautions: Fall Precaution Comments: R cast shoe (foot fx 05/18/19) and has a "bad" R knee and L shoulder limitations Restrictions Weight Bearing Restrictions: No Other Position/Activity Restrictions: WBAT    Mobility  Bed Mobility Overal bed mobility: Needs Assistance Bed Mobility: Sit to Supine     Supine to sit: Min guard;Supervision Sit to supine: Min assist;HOB elevated   General bed mobility comments: assisted B LE up onto bed to decrease pain/effort  Transfers Overall transfer level: Needs assistance Equipment used: Rolling walker (2 wheeled) Transfers: Sit to/from [employer] Sit to Stand: Min guard;Min assist Stand pivot transfers: Min assist        General transfer comment: 25% VC's on proper hand placement and increased time.  Pt was able to transfer from recliner to [facility] then back to recliner.  Then out of recliner to amb.  Ambulation/Gait Ambulation/Gait assistance: Min guard;Min assist Gait Distance (Feet): 40 Feet(20 feet x 2 one seated rest break) Assistive device: Rolling walker (2 wheeled) Gait Pattern/deviations: Step-to pattern;Decreased stance time - left Gait velocity: decreased   General Gait Details: pt tolerated an increased distance.  Slow but steady gait.  Forwad flexed posture.  Has spouse assist as well holding to gait belt.   Stairs             Wheelchair Mobility    Modified Rankin (Stroke Patients Only)       Balance                                            Cognition Arousal/Alertness: Awake/alert Behavior During Therapy: WFL for tasks assessed/performed Overall Cognitive Status: Within Functional Limits for tasks assessed                                 General Comments: pleasant and motivated      Exercises      General Comments        Pertinent Vitals/Pain Pain Assessment: 0-10 Pain Score: 3  Pain Location: L  hip Pain Descriptors / Indicators: Tender;Operative site guarding Pain Intervention(s): Monitored during session;Repositioned;Premedicated before session;Ice applied    Home Living                      Prior Function            PT Goals (current goals can now be found in the care plan section) Progress towards PT goals: Progressing toward goals    Frequency    7X/week      PT Plan Current plan remains appropriate    Co-evaluation              AM-PAC PT "6 Clicks" Mobility   Outcome Measure  Help needed turning from your back to your side while in a flat bed without using bedrails?: A Little Help needed moving from lying on your back to sitting on the side of a flat bed without using bedrails?: A  Little Help needed moving to and from a bed to a chair (including a wheelchair)?: A Little Help needed standing up from a chair using your arms (e.g., wheelchair or bedside chair)?: A Little Help needed to walk in hospital room?: A Little Help needed climbing 3-5 steps with a railing? : A Lot 6 Click Score: 15    End of Session Equipment Utilized During Treatment: Gait belt Activity Tolerance: Patient tolerated treatment well Patient left: in bed;with call bell/phone within reach;with bed alarm set;with family/visitor present Nurse Communication: Mobility status PT Visit Diagnosis: Unsteadiness on feet (R26.81);Muscle weakness (generalized) (M62.81);Difficulty in walking, not elsewhere classified (R26.2);Other abnormalities of gait and mobility (R26.89);History of falling (Z91.81);Pain Pain - Right/Left: Left Pain - part of body: Hip     Time: 1420-1450 PT Time Calculation (min) (ACUTE ONLY): 30 min  Charges:  $Gait Training: 8-22 mins $Therapeutic Activity: 8-22 mins                     Felecia Shelling  PTA Acute  Rehabilitation Services Pager      [phone] Office      [phone]

## 2019-06-22 NOTE — Progress Notes (Signed)
Physical Therapy Treatment Patient Details Name: [name] MRN: [id number] DOB: 1941-04-08 Today's Date: 06/22/2019    History of Present Illness [name] is a 79 y.o. female with medical history significant of hypertension, GERD and history of Covid-19 viral infection in September 2020 who presents to the ER following fall with subsequent left hip pain.  Patient states was walking with her walker and tripped and fell and landed on her left side. Patient is now s/p Lt THA anterior approach on 06/20/19 and is WBAT on Lt LE.    PT Comments    POD # 2 am session Pt pleasant and alert.  Took pain meds prior to session.  Assisted OOB.  General bed mobility comments: demonstared and instructed how to use belt loop to self assist L LE off bed.  Pt was self able at 75% with increased time and segmental mvmt.  Slight assist to complete scooting to EOB usine bed pad.  Minimal c/o pain. General transfer comment: assisted from elevated bed to [facility] as a pre gait activity.  25% VC's on proper hand transfer and turn completion. General Gait Details: + 2 assist for safety and spouse assisting by pushing recliner behind.  Pt was able to advance gait 8 feet with one seated rest break then another 5 feet.  No c/o dizziness. Positioned upright in recliner.  Tolerated session well.   Follow Up Recommendations  SNF(pt does not qualify for CIR)     Equipment Recommendations  None recommended by PT    Recommendations for Other Services       Precautions / Restrictions Precautions Precautions: Fall Precaution Comments: R cast shoe (foot fx 05/18/19) and has a "bad" R knee and L shoulder limitations Restrictions Weight Bearing Restrictions: No Other Position/Activity Restrictions: WBAT    Mobility  Bed Mobility Overal bed mobility: Needs Assistance Bed Mobility: Supine to Sit     Supine to sit: Min guard;Supervision     General bed mobility comments: demonstared and instructed how to use belt  loop to self assist L LE off bed.  Pt was self able at 75% with increased time and segmental mvmt.  Slight assist to complete scooting to EOB usine bed pad.  Minimal c/o pain.  Transfers Overall transfer level: Needs assistance Equipment used: Rolling walker (2 wheeled)(youth) Transfers: Sit to/from [facility] Sit to Stand: Min guard;Min assist Stand pivot transfers: Min assist       General transfer comment: assisted from elevated bed to [facility] as a pre gait activity.  25% VC's on proper hand transfer and turn completion.  Ambulation/Gait Ambulation/Gait assistance: Min assist;+2 safety/equipment Gait Distance (Feet): 13 Feet(8 feet then seated rest break then another 5 feet) Assistive device: Rolling walker (2 wheeled)(youth) Gait Pattern/deviations: Step-to pattern;Decreased stance time - left Gait velocity: decreased   General Gait Details: + 2 assist for safety and spouse assisting by pushing recliner behind.  Pt was able to advance gait 8 feet with one seated rest break then another 5 feet.  No c/o dizziness.   Stairs             Wheelchair Mobility    Modified Rankin (Stroke Patients Only)       Balance                                            Cognition Arousal/Alertness: Awake/alert Behavior  During Therapy: WFL for tasks assessed/performed Overall Cognitive Status: Within Functional Limits for tasks assessed                                 General Comments: Very motivated and Indep strong attitude      Exercises      General Comments        Pertinent Vitals/Pain Pain Assessment: 0-10 Pain Score: 3  Pain Location: K hip Pain Descriptors / Indicators: Tender;Operative site guarding Pain Intervention(s): Monitored during session;Premedicated before session;Repositioned;Ice applied    Home Living                      Prior Function            PT Goals (current goals can now be found in  the care plan section) Progress towards PT goals: Progressing toward goals    Frequency    7X/week      PT Plan Current plan remains appropriate    Co-evaluation              AM-PAC PT "6 Clicks" Mobility   Outcome Measure  Help needed turning from your back to your side while in a flat bed without using bedrails?: A Little Help needed moving from lying on your back to sitting on the side of a flat bed without using bedrails?: A Little Help needed moving to and from a bed to a chair (including a wheelchair)?: A Little Help needed standing up from a chair using your arms (e.g., wheelchair or bedside chair)?: A Little Help needed to walk in hospital room?: A Little Help needed climbing 3-5 steps with a railing? : A Lot 6 Click Score: 17    End of Session Equipment Utilized During Treatment: Gait belt Activity Tolerance: Patient tolerated treatment well Patient left: in chair;with chair alarm set;with family/visitor present;with call bell/phone within reach Nurse Communication: Mobility status PT Visit Diagnosis: Unsteadiness on feet (R26.81);Muscle weakness (generalized) (M62.81);Difficulty in walking, not elsewhere classified (R26.2);Other abnormalities of gait and mobility (R26.89);History of falling (Z91.81);Pain Pain - Right/Left: Left Pain - part of body: Hip     Time: 3893-7342 PT Time Calculation (min) (ACUTE ONLY): 36 min  Charges:  $Gait Training: 8-22 mins $Therapeutic Activity: 8-22 mins                     [name]  PTA Acute  Rehabilitation Services Pager      [phone] Office      [phone]

## 2019-06-22 NOTE — TOC Progression Note (Signed)
Transition of Care [facility]) - Progression Note    Patient Details  Name: [name] MRN: [id number] Date of Birth: [birth year]  [facility] CM/SW Contact  Samaira Holzworth, Olegario Messier, RN Phone Number: 06/22/2019, 11:47 AM  Clinical Narrative: Noted CIR-not appropriate. PT to re eval.Patient in agreement if SNF is recc.CM following.      Expected Discharge Plan: Skilled Nursing Facility Barriers to Discharge: Continued Medical Work up  Expected Discharge Plan and Services Expected Discharge Plan: Skilled Nursing Facility   Discharge Planning Services: CM Consult                                           Social Determinants of Health (SDOH) Interventions    Readmission Risk Interventions No flowsheet data found.

## 2019-06-22 NOTE — Progress Notes (Signed)
Iv infilltrated this am, patient requested to not be stuck again, MD made aware and stated that we may leave IV out for now

## 2019-06-22 NOTE — Progress Notes (Signed)
Hospitalist progress note   Patient from home, Patient going unclear, Dispo likely to skilled facility 1/29  [name] [id number] DOB: 02/02/1941 DOA: 06/20/2019  PCP: [name], MD   Narrative:  79 year old female HTN reflux Covid 01/2019 admitted with left acute subcapital hip fracture after accidental fall Underwent surgical repair 1/26   Data Reviewed:  BUN/creatinine 17/1.09-->16/1.1 Hemoglobin 9.9-->8.1 platelets 91-->88 WBC 6.7 TSH 0.66   Assessment & Plan:  Left hip fracture Status post repair 1/26, pain control, anticoagulation, mobilization with weightbearing precautions as well as further management from hip fracture perspective as per orthopedic surgery Tylenol appears to be first choice, Norco at variable dosing second and third choice OT eval pending for today Will need skilled facility  Mild AKI BUN/creatinine stable since admission DC IV saline received albumin Intra-Op HTN Continue amlodipine 5 mg daily and monitor trends Reflux On high-dose of pantoprazole which is known to have deleterious effects on B12 and calcium-may need a lower dose Expected blood loss anemia from surgery Thrombocytopenia Monitor trends of platelets will need aspirin on discharge Should thrombocytopenia worsen will require work-up  Discussed at the bedside husband  Subjective:  More awake alert Seems insistent on going home No chest pain Still having discomfort in the left lower extremity cannot raise above bed States was able to get up yesterday with therapy but only went to the bedside commode   Consultants:   orthopedics  Objective: Vitals:   06/21/19 1308 06/21/19 2056 06/22/19 0453 06/22/19 1243  BP:  (!) 129/46 121/61 (!) 111/53  Pulse: 95 84 80 72  Resp:  18 14 18   Temp: 98 F (36.7 C) 99.3 F (37.4 C) 99.1 F (37.3 C) 98.4 F (36.9 C)  TempSrc: Oral Oral Oral Oral  SpO2: 91% 95% 94% (!) 89%  Weight:      Height:        Intake/Output Summary (Last 24  hours) at 06/22/2019 1542 Last data filed at 06/22/2019 0600 Gross per 24 hour  Intake 550 ml  Output -  Net 550 ml   Filed Weights   06/20/19 1622 06/20/19 2106 06/21/19 0500  Weight: 74.4 kg 76.2 kg 77.1 kg    Examination: Coherent no issues no nausea no vomiting EOMI NCAT Chest clear Abdomen soft No focal deficit Wound not examined but straight leg raise limited independently on left side   Scheduled Meds: . amLODipine  2.5 mg Oral Daily  . docusate sodium  100 mg Oral BID  . enoxaparin (LOVENOX) injection  40 mg Subcutaneous Q24H  . feeding supplement (ENSURE ENLIVE)  237 mL Oral Q24H  . multivitamin with minerals  1 tablet Oral Daily  . pantoprazole  80 mg Oral Q1200  . senna  1 tablet Oral BID   Continuous Infusions: . sodium chloride 50 mL/hr at 06/22/19 0110     LOS: 2 days   Time spent: 86 31, MD [facility]  06/22/2019, 3:42 PM

## 2019-06-22 NOTE — Evaluation (Signed)
Occupational Therapy Evaluation Patient Details Name: [name] MRN: [id number] DOB: 20-Oct-1940 Today's Date: 06/22/2019    History of Present Illness [name] is a 79 y.o. female with medical history significant of hypertension, GERD and history of Covid-19 viral infection in September 2020 who presents to the ER following fall with subsequent left hip pain.  Patient states was walking with her walker and tripped and fell and landed on her left side. Patient is now s/p Lt THA anterior approach on 06/20/19 and is WBAT on Lt LE.   Clinical Impression   Pt admitted with LTHA s/p fall.  Pt currently with functional limitations due to the deficits listed below (see OT Problem List).  Pt will benefit from skilled OT to increase their safety and independence with ADL and functional mobility for ADL to facilitate discharge to venue listed below.      Follow Up Recommendations  SNF    Equipment Recommendations  None recommended by OT    Recommendations for Other Services       Precautions / Restrictions Precautions Precautions: Fall Precaution Comments: R cast shoe (foot fx 05/18/19) and has a "bad" R knee and L shoulder limitations Restrictions Weight Bearing Restrictions: No Other Position/Activity Restrictions: WBAT      Mobility Bed Mobility Overal bed mobility: Needs Assistance Bed Mobility: Sit to Supine     Supine to sit: Min guard;Supervision Sit to supine: Min assist;HOB elevated   General bed mobility comments: pt inchair  Transfers Overall transfer level: Needs assistance Equipment used: Rolling walker (2 wheeled) Transfers: Sit to/from Stand Sit to Stand: Mod assist Stand pivot transfers: Min assist       General transfer comment: .    Balance Overall balance assessment: Needs assistance Sitting-balance support: Single extremity supported;Feet supported Sitting balance-Leahy Scale: Fair     Standing balance support: During functional  activity;Bilateral upper extremity supported Standing balance-Leahy Scale: Poor                             ADL either performed or assessed with clinical judgement   ADL Overall ADL's : Needs assistance/impaired Eating/Feeding: Set up;Sitting   Grooming: Wash/dry face;Sitting;Set up   Upper Body Bathing: Set up;Sitting   Lower Body Bathing: Cueing for safety;Cueing for sequencing;Cueing for compensatory techniques;Sit to/from stand;Maximal assistance   Upper Body Dressing : Set up;Sitting   Lower Body Dressing: Maximal assistance;Sit to/from stand;Cueing for compensatory techniques;Cueing for safety;Cueing for sequencing                 General ADL Comments: pt will need post acute rehab to increase I with ADL activity                  Pertinent Vitals/Pain Pain Assessment: 0-10 Pain Score: 2  Pain Location: L hip Pain Descriptors / Indicators: Tender;Operative site guarding Pain Intervention(s): Monitored during session;Repositioned;Premedicated before session;Ice applied     Hand Dominance Right   Extremity/Trunk Assessment Upper Extremity Assessment Upper Extremity Assessment: LUE deficits/detail LUE Deficits / Details: decreased AROM since birth.  Pt able to reach walker and use for ADL activity in her lap           Communication Communication Communication: No difficulties   Cognition Arousal/Alertness: Awake/alert Behavior During Therapy: WFL for tasks assessed/performed Overall Cognitive Status: Within Functional Limits for tasks assessed  General Comments: .              Home Living Family/patient expects to be discharged to:: Private residence Living Arrangements: Spouse/significant other;Children Available Help at Discharge: Available 24 hours/day;Family(husband during the day and daughter is available in evenings. she lives with them.) Type of Home: House Home Access: Stairs to  enter;Ramped entrance [employer] of Steps: pt has a ramp to get into her home at front. she has 1 single step inside house from kitchen to the den (no hand rails)   Home Layout: One level     Bathroom Shower/Tub: Chief Strategy Officer: Handicapped height Bathroom Accessibility: Yes   Home Equipment: Environmental consultant - 4 wheels;Walker - 2 wheels;Bedside commode;Tub bench          Prior Functioning/Environment Level of Independence: Independent with assistive device(s)        Comments: pt had a fall in Dec. 202 injuring Rt foot and has been using rollator for gait since that time. prior to that pt was mobilizing in home with no device at times. occasionally she requires some assist from her husband for bathing and dressing due to weakness and Lt UE injury that occured at birth.        OT Problem List: Decreased strength;Impaired balance (sitting and/or standing);Decreased safety awareness;Decreased knowledge of use of DME or AE      OT Treatment/Interventions: Self-care/ADL training;Patient/family education;DME and/or AE instruction;Therapeutic activities    OT Goals(Current goals can be found in the care plan section) Acute Rehab OT Goals Patient Stated Goal: to get to IP Rehab and get stronger to get back to normal prior level of independence OT Goal Formulation: With patient Time For Goal Achievement: 06/27/19  OT Frequency: Min 2X/week    AM-PAC OT "6 Clicks" Daily Activity     Outcome Measure Help from another person eating meals?: None Help from another person taking care of personal grooming?: A Little Help from another person toileting, which includes using toliet, bedpan, or urinal?: A Lot Help from another person bathing (including washing, rinsing, drying)?: A Lot Help from another person to put on and taking off regular upper body clothing?: A Little Help from another person to put on and taking off regular lower body clothing?: A Lot 6 Click  Score: 16   End of Session Equipment Utilized During Treatment: Rolling walker;Gait belt Nurse Communication: Mobility status  Activity Tolerance: Patient tolerated treatment well Patient left: in chair;with call bell/phone within reach;with chair alarm set  OT Visit Diagnosis: Other abnormalities of gait and mobility (R26.89);Muscle weakness (generalized) (M62.81);History of falling (Z91.81)                Time: 3833-3832 OT Time Calculation (min): 21 min Charges:  OT General Charges $OT Visit: 1 Visit OT Evaluation $OT Eval Moderate Complexity: 1 Mod  Lise Auer, OT Acute Rehabilitation Services [phone] Office- [phone]     Kierre Deines, Karin Golden D 06/22/2019, 4:05 PM

## 2019-06-23 DIAGNOSIS — M6281 Muscle weakness (generalized): Secondary | ICD-10-CM | POA: Diagnosis not present

## 2019-06-23 DIAGNOSIS — R488 Other symbolic dysfunctions: Secondary | ICD-10-CM | POA: Diagnosis not present

## 2019-06-23 DIAGNOSIS — Z7401 Bed confinement status: Secondary | ICD-10-CM | POA: Diagnosis not present

## 2019-06-23 DIAGNOSIS — W19XXXA Unspecified fall, initial encounter: Secondary | ICD-10-CM | POA: Diagnosis not present

## 2019-06-23 DIAGNOSIS — W19XXXD Unspecified fall, subsequent encounter: Secondary | ICD-10-CM | POA: Diagnosis not present

## 2019-06-23 DIAGNOSIS — Z96642 Presence of left artificial hip joint: Secondary | ICD-10-CM | POA: Diagnosis not present

## 2019-06-23 DIAGNOSIS — S72032D Displaced midcervical fracture of left femur, subsequent encounter for closed fracture with routine healing: Secondary | ICD-10-CM | POA: Diagnosis not present

## 2019-06-23 DIAGNOSIS — S72002D Fracture of unspecified part of neck of left femur, subsequent encounter for closed fracture with routine healing: Secondary | ICD-10-CM | POA: Diagnosis not present

## 2019-06-23 DIAGNOSIS — H259 Unspecified age-related cataract: Secondary | ICD-10-CM | POA: Diagnosis not present

## 2019-06-23 DIAGNOSIS — Z4789 Encounter for other orthopedic aftercare: Secondary | ICD-10-CM | POA: Diagnosis not present

## 2019-06-23 DIAGNOSIS — R262 Difficulty in walking, not elsewhere classified: Secondary | ICD-10-CM | POA: Diagnosis not present

## 2019-06-23 DIAGNOSIS — M2550 Pain in unspecified joint: Secondary | ICD-10-CM | POA: Diagnosis not present

## 2019-06-23 DIAGNOSIS — S72009D Fracture of unspecified part of neck of unspecified femur, subsequent encounter for closed fracture with routine healing: Secondary | ICD-10-CM | POA: Diagnosis not present

## 2019-06-23 DIAGNOSIS — U071 COVID-19: Secondary | ICD-10-CM | POA: Diagnosis not present

## 2019-06-23 DIAGNOSIS — S72002A Fracture of unspecified part of neck of left femur, initial encounter for closed fracture: Secondary | ICD-10-CM | POA: Diagnosis not present

## 2019-06-23 DIAGNOSIS — I10 Essential (primary) hypertension: Secondary | ICD-10-CM | POA: Diagnosis not present

## 2019-06-23 DIAGNOSIS — K219 Gastro-esophageal reflux disease without esophagitis: Secondary | ICD-10-CM | POA: Diagnosis not present

## 2019-06-23 DIAGNOSIS — K5900 Constipation, unspecified: Secondary | ICD-10-CM | POA: Diagnosis not present

## 2019-06-23 DIAGNOSIS — N179 Acute kidney failure, unspecified: Secondary | ICD-10-CM | POA: Diagnosis not present

## 2019-06-23 DIAGNOSIS — D649 Anemia, unspecified: Secondary | ICD-10-CM | POA: Diagnosis not present

## 2019-06-23 DIAGNOSIS — Z471 Aftercare following joint replacement surgery: Secondary | ICD-10-CM | POA: Diagnosis not present

## 2019-06-23 LAB — BASIC METABOLIC PANEL
Anion gap: 6 (ref 5–15)
BUN: 20 mg/dL (ref 8–23)
CO2: 27 mmol/L (ref 22–32)
Calcium: 8 mg/dL — ABNORMAL LOW (ref 8.9–10.3)
Chloride: 103 mmol/L (ref 98–111)
Creatinine, Ser: 1.04 mg/dL — ABNORMAL HIGH (ref 0.44–1.00)
GFR calc Af Amer: 58 mL/min — ABNORMAL LOW (ref >60)
GFR calc non Af Amer: 50 mL/min — ABNORMAL LOW (ref >60)
Glucose, Bld: 103 mg/dL — ABNORMAL HIGH (ref 70–99)
Potassium: 3.8 mmol/L (ref 3.5–5.1)
Sodium: 136 mmol/L (ref 135–145)

## 2019-06-23 LAB — CBC
HCT: 23.8 % — ABNORMAL LOW (ref 36.0–46.0)
Hemoglobin: 7.8 g/dL — ABNORMAL LOW (ref 12.0–15.0)
MCH: 31.1 pg (ref 26.0–34.0)
MCHC: 32.8 g/dL (ref 30.0–36.0)
MCV: 94.8 fL (ref 80.0–100.0)
Platelets: 85 10*3/uL — ABNORMAL LOW (ref 150–400)
RBC: 2.51 MIL/uL — ABNORMAL LOW (ref 3.87–5.11)
RDW: 12.5 % (ref 11.5–15.5)
WBC: 5.4 10*3/uL (ref 4.0–10.5)
nRBC: 0 % (ref 0.0–0.2)

## 2019-06-23 LAB — SARS CORONAVIRUS 2 (TAT 6-24 HRS): SARS Coronavirus 2: NEGATIVE

## 2019-06-23 MED ORDER — METHOCARBAMOL 500 MG PO TABS: 500.0000 mg | ORAL_TABLET | Freq: Four times a day (QID) | ORAL | Status: AC | PRN

## 2019-06-23 MED ORDER — AMLODIPINE BESYLATE 2.5 MG PO TABS: 2.5000 mg | ORAL_TABLET | Freq: Every day | ORAL | Status: AC

## 2019-06-23 MED ORDER — POLYETHYLENE GLYCOL 3350 17 G PO PACK: 17.0000 g | PACK | Freq: Every day | ORAL | 0 refills | Status: AC | PRN

## 2019-06-23 NOTE — Discharge Summary (Signed)
Physician Discharge Summary  [name] YTK:[id number] DOB: 07/20/1940 DOA: 06/20/2019  PCP: Wendy Funk, MD  Admit date: 06/20/2019 Discharge date: 06/23/2019  Time spent: 37 minutes  Recommendations for Outpatient Follow-up:  1. Needs outpatient skilled placement for rehab 2. Continue aspirin 81 twice daily for 4 weeks ending on 2/28 2021 and then may continue once daily 3. Weightbearing as tolerated left lower extremity 4. Chem-12, CBC outpatient 5. If as an outpatient platelets are still low may consider work-up in the outpatient setting with regards to thrombocytopenia/?  MDS etc.  Discharge Diagnoses:  Principal Problem:   Hip fracture (HCC) Active Problems:   GERD (gastroesophageal reflux disease)   Benign essential HTN   Discharge Condition: Improved  Diet recommendation: Regular  Filed Weights   06/20/19 2106 06/21/19 0500 06/23/19 0500  Weight: 76.2 kg 77.1 kg 76.8 kg    History of present illness:  79 year old female HTN reflux Covid 01/2019 admitted with left acute subcapital hip fracture after accidental fall Underwent surgical repair 1/26  Hospital Course:  Left hip fracture Status post repair 1/26, pain control, anticoagulation, mobilization with weightbearing precautions as well as further management from hip fracture perspective as per orthopedic surgery Tylenol appears to be first choice, Norco at variable dosing second and third choice-aspirin 81 twice daily until date has been Mild AKI BUN/creatinine stable since admission DC IV saline received albumin Intra-Op HTN Amlodipine dose down titrated to 2.5 daily Reflux On high-dose of pantoprazole which is known to have deleterious effects on B12 and calcium-may need a lower dose-transition to omeprazole which is usual home dose on discharge Expected blood loss anemia from surgery Thrombocytopenia Monitor trends of platelets will need aspirin on discharge Should thrombocytopenia worsen will require  work-up  Procedures:  1/26 hip repair left total hip arthroplasty  Consultations:  Orthopedics Dr. Linna Caprice  Discharge Exam: Vitals:   06/23/19 0447 06/23/19 0842  BP: (!) 132/57 130/61  Pulse: 76   Resp:    Temp: (!) 97.5 F (36.4 C)   SpO2: 95%     General: Awake coherent no distress sitting up in bed states pain is reasonable awaiting work with therapy Cardiovascular: S1-S2 no murmur rub or gallop sinus rhythm Respiratory: Clinically clear no added sound Abdomen soft Left hip has bandages on top I examined below no hematoma no other issues Neurologically intact no focal deficit Psych euthymic  Discharge Instructions   Discharge Instructions    Diet - low sodium heart healthy   Complete by: As directed    Increase activity slowly   Complete by: As directed      Allergies as of 06/23/2019      Reactions   Sulfa Drugs Cross Reactors Rash      Medication List    STOP taking these medications   aspirin 81 MG EC tablet Replaced by: aspirin 81 MG chewable tablet     TAKE these medications   acetaminophen 500 MG tablet Commonly known as: TYLENOL Take 1,000 mg by mouth every 6 (six) hours as needed for mild pain or moderate pain.   alendronate 70 MG tablet Commonly known as: FOSAMAX Take 70 mg by mouth once a week. Thursday   amLODipine 2.5 MG tablet Commonly known as: NORVASC Take 1 tablet (2.5 mg total) by mouth daily. Start taking on: June 24, 2019 What changed:   medication strength  how much to take   aspirin 81 MG chewable tablet Commonly known as: Aspirin Childrens Chew 1 tablet (81 mg total) by  mouth 2 (two) times daily with a meal. Replaces: aspirin 81 MG EC tablet   esomeprazole 40 MG capsule Commonly known as: NEXIUM Take 40 mg by mouth daily.   HYDROcodone-acetaminophen 5-325 MG tablet Commonly known as: NORCO/VICODIN Take 1 tablet by mouth every 4 (four) hours as needed for moderate pain.   methocarbamol 500 MG  tablet Commonly known as: ROBAXIN Take 1 tablet (500 mg total) by mouth every 6 (six) hours as needed for muscle spasms.   multivitamin with minerals tablet Take 1 tablet by mouth daily.   ondansetron 4 MG disintegrating tablet Commonly known as: Zofran ODT Take 1 tablet (4 mg total) by mouth every 8 (eight) hours as needed for nausea or vomiting.   polyethylene glycol 17 g packet Commonly known as: MIRALAX / GLYCOLAX Take 17 g by mouth daily as needed for mild constipation.   VITAMIN D3 PO Take 1 tablet by mouth at bedtime.      Allergies  Allergen Reactions  . Sulfa Drugs Cross Reactors Rash   Follow-up Information    Swinteck, Arlys John, MD. Schedule an appointment as soon as possible for a visit in 2 weeks.   Specialty: Orthopedic Surgery Why: For suture removal Contact information: [address] [place] [address] [phone]            The results of significant diagnostics from this hospitalization (including imaging, microbiology, ancillary and laboratory) are listed below for reference.    Significant Diagnostic Studies: CT Knee Left Wo Contrast  Result Date: 06/20/2019 CLINICAL DATA:  Left knee pain after a fall. EXAM: CT OF THE LEFT KNEE WITHOUT CONTRAST TECHNIQUE: Multidetector CT imaging of the left knee was performed according to the standard protocol. Multiplanar CT image reconstructions were also generated. COMPARISON:  Radiographs dated 06/20/2019 and 11/03/2013 FINDINGS: Bones/Joint/Cartilage There is no visible fracture of the left knee. The components of the total knee prosthesis demonstrate no evidence of loosening. 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 The detail of the patellar component of the prosthesis is partially obscured by the metallic artifact from the distal femoral component. No appreciable joint effusion. Muscles and Tendons Negative.  Soft tissues Negative. IMPRESSION: No acute abnormality of the left knee. 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 Electronically Signed   By: Francene Boyers M.D.   On: 06/20/2019 13:34   Pelvis Portable  Result Date: 06/20/2019 CLINICAL DATA:  Post left hip replacement EXAM: PORTABLE PELVIS 1-2 VIEWS COMPARISON:  None. FINDINGS: Changes of left hip replacement. Normal AP alignment. No hardware bony complicating feature. IMPRESSION: Left hip replacement.  No visible complicating feature. Electronically Signed   By: Charlett Nose M.D.   On: 06/20/2019 19:39   CT Hip Left Wo Contrast  Result Date: 06/20/2019 CLINICAL DATA:  The patient suffered a fall today when her left knee gave way. Left hip pain. Initial encounter. EXAM: CT OF THE LEFT HIP WITHOUT CONTRAST TECHNIQUE: Multidetector CT imaging of the left hip was performed according to the standard protocol. Multiplanar CT image reconstructions were also generated. COMPARISON:  Plain films left hip earlier today. FINDINGS: Bones/Joint/Cartilage The patient has an acute subcapital fracture of the left hip with mild impaction at the superior margin of the fracture. The femoral head is located. No other fracture is identified. No evidence of arthropathy about the hip. No focal bone lesion. Ligaments Suboptimally assessed by CT. Muscles and Tendons Intact and normal in appearance. Soft tissues  Imaged intrapelvic contents demonstrate no acute abnormality. IMPRESSION: Acute subcapital fracture left hip. Electronically Signed   By: Drusilla Kannerhomas  Dalessio M.D.   On: 06/20/2019 13:25   DG Knee Complete 4 Views Left  Result Date: 06/20/2019 CLINICAL DATA:  Fall EXAM: LEFT KNEE - COMPLETE 4+ VIEW COMPARISON:  11/03/2013 FINDINGS: Status post left knee total arthroplasty. There is redemonstrated fragmentation and distraction of the left patella, unchanged in appearance when compared to examination dated 11/03/2013. No evidence of perihardware fracture or  loosening. Soft tissues are unremarkable. IMPRESSION: Status post left knee total arthroplasty. There is redemonstrated fragmentation and distraction of the left patella, unchanged in appearance when compared to examination dated 11/03/2013. No evidence of perihardware fracture or loosening. Electronically Signed   By: Lauralyn PrimesAlex  Bibbey M.D.   On: 06/20/2019 12:02   DG C-Arm 1-60 Min-No Report  Result Date: 06/20/2019 Fluoroscopy was utilized by the requesting physician.  No radiographic interpretation.   DG HIP OPERATIVE UNILAT W OR W/O PELVIS LEFT  Result Date: 06/20/2019 CLINICAL DATA:  Left hip replacement. EXAM: OPERATIVE LEFT HIP (WITH PELVIS IF PERFORMED)  VIEWS TECHNIQUE: Fluoroscopic spot image(s) were submitted for interpretation post-operatively. COMPARISON:  None. FINDINGS: A radiopaque total left hip replacement is inserted. No acute fracture or dislocation is seen. IMPRESSION: 1. Intact total left hip replacement. Electronically Signed   By: Aram Candelahaddeus  Houston M.D.   On: 06/20/2019 18:47   DG Hip Unilat With Pelvis 2-3 Views Left  Result Date: 06/20/2019 CLINICAL DATA:  Left hip pain secondary to a fall. EXAM: DG HIP (WITH OR WITHOUT PELVIS) 2-3V LEFT COMPARISON:  Pelvic radiograph dated 11/07/2010. FINDINGS: There is no dislocation. The patient was unable to be positioned for standard projections. There is no discrete fracture of the pelvis or of the hips. However, I do not feel that the right femoral neck is adequately assessed on the available images. CT scan of hip is recommended for further evaluation. IMPRESSION: No visible fractures of the pelvis or hips. However, the patient was unable to be positioned for standard projections. Recommend CT scan of the hips for further evaluation because I cannot completely clear the left femoral neck. Electronically Signed   By: Francene BoyersJames  Maxwell M.D.   On: 06/20/2019 13:00    Microbiology: Recent Results (from the past 240 hour(s))  Respiratory Panel  by RT PCR (Flu A&B, Covid) - Nasopharyngeal Swab     Status: None   Collection Time: 06/20/19  2:20 PM   Specimen: Nasopharyngeal Swab  Result Value Ref Range Status   SARS Coronavirus 2 by RT PCR NEGATIVE NEGATIVE Final    Comment: (NOTE) SARS-CoV-2 target nucleic acids are NOT DETECTED. The SARS-CoV-2 RNA is generally detectable in upper respiratoy specimens during the acute phase of infection. The lowest concentration of SARS-CoV-2 viral copies this assay can detect is 131 copies/mL. A negative result does not preclude SARS-Cov-2 infection and should not be used as the sole basis for treatment or other patient management decisions. A negative result may occur with  improper specimen collection/handling, submission of specimen other than nasopharyngeal swab, presence of viral mutation(s) within the areas targeted by this assay, and inadequate number of viral copies (<131 copies/mL). A negative result must be combined with clinical observations, patient history, and epidemiological information. The expected result is Negative. Fact Sheet for Patients:  https://www.moore.com/https://www.fda.gov/media/142436/download Fact Sheet for Healthcare Providers:  https://www.young.biz/https://www.fda.gov/media/142435/download This test is not yet ap proved or cleared by the [place] FDA and  has been authorized for detection and/or  diagnosis of SARS-CoV-2 by FDA under an Emergency Use Authorization (EUA). This EUA will remain  in effect (meaning this test can be used) for the duration of the COVID-19 declaration under Section 564(b)(1) of the Act, 21 U.S.C. section 360bbb-3(b)(1), unless the authorization is terminated or revoked sooner.    Influenza A by PCR NEGATIVE NEGATIVE Final   Influenza B by PCR NEGATIVE NEGATIVE Final    Comment: (NOTE) The Xpert Xpress SARS-CoV-2/FLU/RSV assay is intended as an aid in  the diagnosis of influenza from Nasopharyngeal swab specimens and  should not be used as a sole basis for treatment.  Nasal washings and  aspirates are unacceptable for Xpert Xpress SARS-CoV-2/FLU/RSV  testing. Fact Sheet for Patients: PinkCheek.be Fact Sheet for Healthcare Providers: GravelBags.it This test is not yet approved or cleared by the [place] FDA and  has been authorized for detection and/or diagnosis of SARS-CoV-2 by  FDA under an Emergency Use Authorization (EUA). This EUA will remain  in effect (meaning this test can be used) for the duration of the  Covid-19 declaration under Section 564(b)(1) of the Act, 21  U.S.C. section 360bbb-3(b)(1), unless the authorization is  terminated or revoked. Performed at [facility], [place] [address] [place], [place] [address]   SARS CORONAVIRUS 2 (TAT 6-24 HRS) Nasopharyngeal Nasopharyngeal Swab     Status: None   Collection Time: 06/22/19  3:53 PM   Specimen: Nasopharyngeal Swab  Result Value Ref Range Status   SARS Coronavirus 2 NEGATIVE NEGATIVE Final    Comment: (NOTE) SARS-CoV-2 target nucleic acids are NOT DETECTED. The SARS-CoV-2 RNA is generally detectable in upper and lower respiratory specimens during the acute phase of infection. Negative results do not preclude SARS-CoV-2 infection, do not rule out co-infections with other pathogens, and should not be used as the sole basis for treatment or other patient management decisions. Negative results must be combined with clinical observations, patient history, and epidemiological information. The expected result is Negative. Fact Sheet for Patients: SugarRoll.be Fact Sheet for Healthcare Providers: https://www.woods-mathews.com/ This test is not yet approved or cleared by the [place] FDA and  has been authorized for detection and/or diagnosis of SARS-CoV-2 by FDA under an Emergency Use Authorization (EUA). This EUA will remain  in effect (meaning this test can be  used) for the duration of the COVID-19 declaration under Section 56 4(b)(1) of the Act, 21 U.S.C. section 360bbb-3(b)(1), unless the authorization is terminated or revoked sooner. Performed at [facility], [place] [address], [place], [place] [address]      Labs: Basic Metabolic Panel: Recent Labs  Lab 06/20/19 1245 06/20/19 1929 06/21/19 0339 06/22/19 0439 06/23/19 0443  NA 141 140 140 136 136  K 3.7 4.2 4.0 3.7 3.8  CL 108 105 107 106 103  CO2 26  --  26 25 27   GLUCOSE 175* 106* 111* 112* 103*  BUN 19 16 17 16 20   CREATININE 1.04* 0.90 1.09* 1.12* 1.04*  CALCIUM 8.6*  --  7.9* 7.9* 8.0*  MG  --   --  1.9  --   --    Liver Function Tests: No results for input(s): AST, ALT, ALKPHOS, BILITOT, PROT, ALBUMIN in the last 168 hours. No results for input(s): LIPASE, AMYLASE in the last 168 hours. No results for input(s): AMMONIA in the last 168 hours. CBC: Recent Labs  Lab 06/20/19 1245 06/20/19 1929 06/21/19 0339 06/22/19 0439 06/23/19 0443  WBC 10.1  --  6.7 6.7 5.4  NEUTROABS  7.7  --   --   --   --   HGB 12.3 9.9* 7.9* 8.1* 7.8*  HCT 36.9 29.0* 24.0* 25.0* 23.8*  MCV 93.9  --  94.9 96.2 94.8  PLT 156  --  91* 88* 85*   Cardiac Enzymes: No results for input(s): CKTOTAL, CKMB, CKMBINDEX, TROPONINI in the last 168 hours. BNP: BNP (last 3 results) No results for input(s): BNP in the last 8760 hours.  ProBNP (last 3 results) No results for input(s): PROBNP in the last 8760 hours.  CBG: No results for input(s): GLUCAP in the last 168 hours.     Signed:  Rhetta Mura MD   [facility] 06/23/2019, 9:51 AM

## 2019-06-23 NOTE — TOC Transition Note (Signed)
Transition of Care [facility]) - CM/SW Discharge Note   Patient Details  Name: [name] MRN: [id number] Date of Birth: 01/20/1941  [facility] CM/SW Contact:  Lanier Clam, RN Phone Number: 06/23/2019, 1:35 PM   Clinical Narrative:  Additional facilities patient wanted to fax out to [facility] no beds available. Son August Saucer chose [facility] [facility]-D/c today to [facility] have a bed available per rep [place] Ana awaiting rm,tel# for nsg to call report to. CM has faxed w/confirmation d/c summary. [facility] once have rm#, & report # available-Nsg aware.    Final next level of care: Skilled Nursing Facility Barriers to Discharge: No Barriers Identified   Patient Goals and CMS Choice Patient states their goals for this hospitalization and ongoing recovery are:: "to get stronger and return home". CMS Medicare.gov Compare Post Acute Care list provided to:: Patient Choice offered to / list presented to : Patient  Discharge Placement PASRR number recieved: 06/23/19            Patient chooses bed at: [facility] Patient to be transferred to facility by: [facility] Name of family member notified: Anne Hahn spouse/Dean son Patient and family notified of of transfer: 06/23/19  Discharge Plan and Services   Discharge Planning Services: CM Consult                                 Social Determinants of Health (SDOH) Interventions     Readmission Risk Interventions No flowsheet data found.

## 2019-06-23 NOTE — Progress Notes (Signed)
Physical Therapy Treatment Patient Details Name: [name] MRN: [id number] DOB: June 14, 1940 Today's Date: 06/23/2019    History of Present Illness [name] is a 79 y.o. female with medical history significant of hypertension, GERD and history of Covid-19 viral infection in September 2020 who presents to the ER following fall with subsequent left hip pain.  Patient states was walking with her walker and tripped and fell and landed on her left side. Patient is now s/p Lt THA anterior approach on 06/20/19 and is WBAT on Lt LE.    PT Comments    POD # 3 pm session Assisted out of recliner to amb a few feet to [facility] to void.  General transfer comment: assisted from recliner to [facility] to void and then up to amb.  Assisted with amb in hallway 38 feet.  General Gait Details: 25% VC's on proper upright posture and safety with turns.  Assisted back to bed.  General bed mobility comments: assisted back to bed supporting B LE to minimize L hip pain. Pt plans to D/C to SNF today.   Follow Up Recommendations  SNF     Equipment Recommendations  None recommended by PT    Recommendations for Other Services       Precautions / Restrictions Precautions Precautions: Fall Precaution Comments: R cast shoe (foot fx 05/18/19) and has a "bad" R knee and L shoulder limitations Restrictions Weight Bearing Restrictions: No Other Position/Activity Restrictions: WBAT    Mobility  Bed Mobility Overal bed mobility: Needs Assistance Bed Mobility: Sit to Supine           General bed mobility comments: assisted back to bed supporting B LE to minimize L hip pain  Transfers Overall transfer level: Needs assistance Equipment used: Rolling walker (2 wheeled) Transfers: Sit to/from Stand Sit to Stand: Min assist Stand pivot transfers: Min assist       General transfer comment: assisted from recliner to [facility] to void and then up to amb  Ambulation/Gait Ambulation/Gait assistance: Min guard;Min  assist Gait Distance (Feet): 38 Feet Assistive device: Rolling walker (2 wheeled) Gait Pattern/deviations: Step-to pattern;Decreased stance time - left Gait velocity: decreased   General Gait Details: 25% VC's on proper upright posture and safety with turns   Chief Strategy Officer    Modified Rankin (Stroke Patients Only)       Balance                                            Cognition Arousal/Alertness: Awake/alert Behavior During Therapy: WFL for tasks assessed/performed Overall Cognitive Status: Within Functional Limits for tasks assessed                                        Exercises      General Comments        Pertinent Vitals/Pain Pain Assessment: 0-10 Pain Score: 5  Pain Location: L hip Pain Descriptors / Indicators: Tender;Operative site guarding;Discomfort Pain Intervention(s): Monitored during session;Repositioned;Ice applied    Home Living                      Prior Function            PT  Goals (current goals can now be found in the care plan section) Progress towards PT goals: Progressing toward goals    Frequency    7X/week      PT Plan Current plan remains appropriate    Co-evaluation              AM-PAC PT "6 Clicks" Mobility   Outcome Measure  Help needed turning from your back to your side while in a flat bed without using bedrails?: A Little Help needed moving from lying on your back to sitting on the side of a flat bed without using bedrails?: A Little Help needed moving to and from a bed to a chair (including a wheelchair)?: A Little Help needed standing up from a chair using your arms (e.g., wheelchair or bedside chair)?: A Little Help needed to walk in hospital room?: A Little Help needed climbing 3-5 steps with a railing? : A Lot 6 Click Score: 17    End of Session Equipment Utilized During Treatment: Gait belt Activity Tolerance: Patient  tolerated treatment well Patient left: with call bell/phone within reach;in bed;with bed alarm set;with family/visitor present Nurse Communication: Mobility status PT Visit Diagnosis: Unsteadiness on feet (R26.81);Muscle weakness (generalized) (M62.81);Difficulty in walking, not elsewhere classified (R26.2);Other abnormalities of gait and mobility (R26.89);History of falling (Z91.81);Pain Pain - Right/Left: Left     Time: 6433-2951 PT Time Calculation (min) (ACUTE ONLY): 29 min  Charges:  $Gait Training: 8-22 mins $Therapeutic Activity: 8-22 mins                     Felecia Shelling  PTA Acute  Rehabilitation Services Pager      [phone] Office      [phone]

## 2019-06-23 NOTE — NC FL2 (Signed)
[place] LEVEL OF CARE SCREENING TOOL     IDENTIFICATION  Patient Name: [name] Birthdate: Jul 17, 1940 Sex: female Admission Date (Current Location): 06/20/2019  [facility] and [place] Number:  Herbalist and Address:  [facility],  [place] [address], [place]      Provider Number: [id number]  Attending Physician Name and Address:  Nita Sells, MD  Relative Name and Phone Number:  Suman Trivedi [phone]    Current Level of Care: Hospital Recommended Level of Care: [place] Prior Approval Number:    Date Approved/Denied:   PASRR Number: [id number]  Discharge Plan: SNF    Current Diagnoses: Patient Active Problem List   Diagnosis Date Noted  . Hip fracture ([place]) 06/20/2019  . GERD (gastroesophageal reflux disease) 06/20/2019  . Benign essential HTN 06/20/2019  . Unspecified vitamin D deficiency 10/10/2010  . Osteopenia   . Gastroenteritis 09/13/2010  . UTI (lower urinary tract infection) 09/13/2010    Orientation RESPIRATION BLADDER Height & Weight     Self, Time, Situation, Place  Normal Incontinent, External catheter Weight: 169 lb 5 oz (76.8 kg) Height:  5\' 3"  (160 cm)  BEHAVIORAL SYMPTOMS/MOOD NEUROLOGICAL BOWEL NUTRITION STATUS      Continent Diet(Regular)  AMBULATORY STATUS COMMUNICATION OF NEEDS Skin   Limited Assist Verbally Surgical wounds(Silicone dressing on Hip.)                       Personal Care Assistance Level of Assistance  Bathing, Feeding, Dressing Bathing Assistance: Maximum assistance Feeding assistance: Limited assistance(Will need to be set up) Dressing Assistance: Maximum assistance     Functional Limitations Info  Sight, Hearing, Speech Sight Info: Adequate Hearing Info: Adequate Speech Info: Adequate    SPECIAL CARE FACTORS FREQUENCY  PT (By licensed PT), OT (By licensed OT)     PT Frequency: 7 x weekly OT Frequency: 2 x weekly             Contractures Contractures Info: Not present    Additional Factors Info  Code Status, Allergies Code Status Info: Full Allergies Info: Sulfa Drugs Cross Reactors           Current Medications (06/23/2019):  This is the current hospital active medication list Current Facility-Administered Medications  Medication Dose Route Frequency Provider Last Rate Last Admin  . 0.9 %  sodium chloride infusion   Intravenous Continuous Nita Sells, MD   Stopped at 06/22/19 1049  . acetaminophen (TYLENOL) tablet 650 mg  650 mg Oral Q6H PRN [place] Donnamarie Poag, DO       Or  . acetaminophen (TYLENOL) suppository 650 mg  650 mg Rectal Q6H PRN [place] Donnamarie Poag, DO      . acetaminophen (TYLENOL) tablet 325-650 mg  325-650 mg Oral Q6H PRN Swinteck, Aaron Edelman, MD      . amLODipine (NORVASC) tablet 2.5 mg  2.5 mg Oral Daily Nita Sells, MD   2.5 mg at 06/23/19 0843  . docusate sodium (COLACE) capsule 100 mg  100 mg Oral BID Rod Can, MD   100 mg at 06/23/19 0843  . enoxaparin (LOVENOX) injection 40 mg  40 mg Subcutaneous Q24H Rod Can, MD   40 mg at 06/23/19 0844  . feeding supplement (ENSURE ENLIVE) (ENSURE ENLIVE) liquid 237 mL  237 mL Oral Q24H Nita Sells, MD   237 mL at 06/23/19 0847  . HYDROcodone-acetaminophen (NORCO) 7.5-325 MG per tablet 1-2 tablet  1-2 tablet Oral Q4H PRN  Swinteck, Arlys John, MD      . HYDROcodone-acetaminophen (NORCO/VICODIN) 5-325 MG per tablet 1-2 tablet  1-2 tablet Oral Q6H PRN [place], Alvira Philips, DO      . HYDROcodone-acetaminophen (NORCO/VICODIN) 5-325 MG per tablet 1-2 tablet  1-2 tablet Oral Q4H PRN Samson Frederic, MD   2 tablet at 06/23/19 0917  . menthol-cetylpyridinium (CEPACOL) lozenge 3 mg  1 lozenge Oral PRN Swinteck, Arlys John, MD       Or  . phenol (CHLORASEPTIC) mouth spray 1 spray  1 spray Mouth/Throat PRN Swinteck, Arlys John, MD      . methocarbamol (ROBAXIN) tablet 500 mg  500 mg Oral Q6H PRN [place], Eric J, DO      . metoCLOPramide (REGLAN)  tablet 5-10 mg  5-10 mg Oral Q8H PRN Swinteck, Arlys John, MD      . multivitamin with minerals tablet 1 tablet  1 tablet Oral Daily Rhetta Mura, MD   1 tablet at 06/23/19 0843  . ondansetron (ZOFRAN) tablet 4 mg  4 mg Oral Q6H PRN [place], Alvira Philips, DO       Or  . ondansetron [facility]) injection 4 mg  4 mg Intravenous Q6H PRN [place], Eric J, DO   4 mg at 06/21/19 1303  . pantoprazole (PROTONIX) EC tablet 80 mg  80 mg Oral Q1200 [place], Eric J, DO   80 mg at 06/22/19 1204  . polyethylene glycol (MIRALAX / GLYCOLAX) packet 17 g  17 g Oral Daily PRN [place], Alvira Philips, DO      . senna (SENOKOT) tablet 8.6 mg  1 tablet Oral BID Samson Frederic, MD   8.6 mg at 06/23/19 8099     Discharge Medications: Please see discharge summary for a list of discharge medications.  Relevant Imaging Results:  Relevant Lab Results:   Additional Information SS# [id number]  Arvin Collard, [place]

## 2019-06-23 NOTE — TOC Transition Note (Signed)
Transition of Care [facility]) - CM/SW Discharge Note   Patient Details  Name: [name] MRN: [id number] Date of Birth: July 02, 1940  [facility]) CM/SW Contact:  Lanier Clam, RN Phone Number: 06/23/2019, 2:08 PM   Clinical Narrative: [facility],tel# for report-[phone].PTAR called-Nsg aware. No further CM needs.      Final next level of care: Skilled Nursing Facility Barriers to Discharge: No Barriers Identified   Patient Goals and CMS Choice Patient states their goals for this hospitalization and ongoing recovery are:: "to get stronger and return home". CMS Medicare.gov Compare Post Acute Care list provided to:: Patient Choice offered to / list presented to : Patient  Discharge Placement PASRR number recieved: 06/23/19            Patient chooses bed at: [facility] Patient to be transferred to facility by: PTAR Name of family member notified: Anne Hahn spouse/Dean son Patient and family notified of of transfer: 06/23/19  Discharge Plan and Services   Discharge Planning Services: CM Consult                                 Social Determinants of Health (SDOH) Interventions     Readmission Risk Interventions No flowsheet data found.

## 2019-06-23 NOTE — Progress Notes (Signed)
Physical Therapy Treatment Patient Details Name: [name] MRN: [id number] DOB: 07-Jun-1940 Today's Date: 06/23/2019    History of Present Illness [name] is a 79 y.o. female with medical history significant of hypertension, GERD and history of Covid-19 viral infection in September 2020 who presents to the ER following fall with subsequent left hip pain.  Patient states was walking with her walker and tripped and fell and landed on her left side. Patient is now s/p Lt THA anterior approach on 06/20/19 and is WBAT on Lt LE.    PT Comments    POD # 3 am session Assisted OOB to [facility].  General bed mobility comments: pt able to self move L LE off bed with using belt loop strap.  General transfer comment: assisted from elevated bed to [facility] then to standing with 25% VC's on proper hand placement and safety with turns.  General Gait Details: tolerated an increased distance. Pt is c/o increased pain today vs other days.  Returned to room and performed some TE's followed by ICE.    Follow Up Recommendations  SNF     Equipment Recommendations  None recommended by PT    Recommendations for Other Services       Precautions / Restrictions Precautions Precautions: Fall Precaution Comments: R cast shoe (foot fx 05/18/19) and has a "bad" R knee and L shoulder limitations Restrictions Weight Bearing Restrictions: No Other Position/Activity Restrictions: WBAT    Mobility  Bed Mobility Overal bed mobility: Needs Assistance Bed Mobility: Supine to Sit     Supine to sit: Min guard;Supervision     General bed mobility comments: pt able to self move L LE off bed with using belt loop strap  Transfers Overall transfer level: Needs assistance Equipment used: Rolling walker (2 wheeled) Transfers: Sit to/from Stand Sit to Stand: Min assist Stand pivot transfers: Min assist       General transfer comment: assisted from elevated bed to [facility] then to standing with 25% VC's on proper hand  placement and safety with turns  Ambulation/Gait Ambulation/Gait assistance: Min guard;Min assist Gait Distance (Feet): 62 Feet(30 feet one seated rest break then another 32 feet) Assistive device: Rolling walker (2 wheeled) Gait Pattern/deviations: Step-to pattern;Decreased stance time - left     General Gait Details: tolerated an increased distance.   Stairs             Wheelchair Mobility    Modified Rankin (Stroke Patients Only)       Balance                                            Cognition Arousal/Alertness: Awake/alert Behavior During Therapy: WFL for tasks assessed/performed Overall Cognitive Status: Within Functional Limits for tasks assessed                                        Exercises  10 reps knee presses   5 reps LAQ's  10 reps Gluealt  sqeezes    General Comments        Pertinent Vitals/Pain Pain Assessment: 0-10 Pain Score: 7  Pain Location: L hip Pain Descriptors / Indicators: Tender;Operative site guarding;Discomfort Pain Intervention(s): Monitored during session;Premedicated before session;Repositioned;Ice applied    Home Living  Prior Function            PT Goals (current goals can now be found in the care plan section) Progress towards PT goals: Progressing toward goals    Frequency    7X/week      PT Plan Current plan remains appropriate    Co-evaluation              AM-PAC PT "6 Clicks" Mobility   Outcome Measure  Help needed turning from your back to your side while in a flat bed without using bedrails?: A Little Help needed moving from lying on your back to sitting on the side of a flat bed without using bedrails?: A Little Help needed moving to and from a bed to a chair (including a wheelchair)?: A Little Help needed standing up from a chair using your arms (e.g., wheelchair or bedside chair)?: A Little Help needed to walk in hospital  room?: A Little Help needed climbing 3-5 steps with a railing? : A Lot 6 Click Score: 17    End of Session Equipment Utilized During Treatment: Gait belt Activity Tolerance: Patient tolerated treatment well Patient left: in chair;with call bell/phone within reach Nurse Communication: Mobility status PT Visit Diagnosis: Unsteadiness on feet (R26.81);Muscle weakness (generalized) (M62.81);Difficulty in walking, not elsewhere classified (R26.2);Other abnormalities of gait and mobility (R26.89);History of falling (Z91.81);Pain Pain - Right/Left: Left Pain - part of body: Hip     Time: 1000-1045 PT Time Calculation (min) (ACUTE ONLY): 45 min  Charges:  $Gait Training: 8-22 mins $Therapeutic Exercise: 8-22 mins $Therapeutic Activity: 8-22 mins                       Felecia Shelling  PTA Acute  Rehabilitation Services Pager      [phone] Office      [phone]
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
## 2019-06-23 NOTE — Care Management Important Message (Signed)
Important Message  Patient Details IM Letter given to Lanier Clam RN Case Manager to present to the Patient Name: RONNIE MALLETTE MRN: [id number] Date of Birth: [birth year]   Medicare Important Message Given:  Yes     Caren Macadam 06/23/2019, 12:22 PM

## 2019-06-26 DIAGNOSIS — N179 Acute kidney failure, unspecified: Secondary | ICD-10-CM | POA: Diagnosis not present

## 2019-06-26 DIAGNOSIS — K219 Gastro-esophageal reflux disease without esophagitis: Secondary | ICD-10-CM | POA: Diagnosis not present

## 2019-06-26 DIAGNOSIS — S72002D Fracture of unspecified part of neck of left femur, subsequent encounter for closed fracture with routine healing: Secondary | ICD-10-CM | POA: Diagnosis not present

## 2019-06-26 DIAGNOSIS — I10 Essential (primary) hypertension: Secondary | ICD-10-CM | POA: Diagnosis not present

## 2019-06-29 ENCOUNTER — Other Ambulatory Visit: Payer: Self-pay | Admitting: *Deleted

## 2019-06-29 NOTE — Patient Outreach (Signed)
Screened for potential [facility] Care Management needs as a benefit of  [employer] Medicare.  Mrs. Monroy is currently receiving skilled therapy at [facility].  Writer attended telephonic interdisciplinary team meeting to assess for disposition needs and transition plan for resident.   Facility reports member is motivated and is progressing well with therapy. She lived alone prior. Care plan meeting scheduled for tomorrow.   Will continue to follow and plan outreach to discuss [facility] services.  [name], MSN-Ed, RN,BSN [facility] Post Acute Care Coordinator [phone] [facility]) [phone]  (Toll free office)

## 2019-06-30 DIAGNOSIS — S72002D Fracture of unspecified part of neck of left femur, subsequent encounter for closed fracture with routine healing: Secondary | ICD-10-CM | POA: Diagnosis not present

## 2019-06-30 DIAGNOSIS — D649 Anemia, unspecified: Secondary | ICD-10-CM | POA: Diagnosis not present

## 2019-06-30 DIAGNOSIS — N179 Acute kidney failure, unspecified: Secondary | ICD-10-CM | POA: Diagnosis not present

## 2019-06-30 DIAGNOSIS — I10 Essential (primary) hypertension: Secondary | ICD-10-CM | POA: Diagnosis not present

## 2019-07-03 DIAGNOSIS — S72032D Displaced midcervical fracture of left femur, subsequent encounter for closed fracture with routine healing: Secondary | ICD-10-CM | POA: Diagnosis not present

## 2019-07-07 ENCOUNTER — Other Ambulatory Visit: Payer: Self-pay | Admitting: *Deleted

## 2019-07-07 NOTE — Patient Outreach (Signed)
Member screened for potential [facility] Care Management needs as a benefit of [employer] Medicare.  Mrs. [name] remains in [facility] SNF receiving skilled therapy.  Received update from [facility] RN. Mrs. [name] is slated for transition to home next week with husband.   Writer will plan outreach to discuss [facility] follow up.  Wendy Noble, MSN-Ed, RN,BSN [facility] Post Acute Care Coordinator [phone] [facility]) [phone]  (Toll free office)

## 2019-07-10 ENCOUNTER — Other Ambulatory Visit: Payer: Self-pay | Admitting: *Deleted

## 2019-07-10 NOTE — Patient Outreach (Signed)
Member screened for potential [facility] Care Management needs as a benefit of [employer] Medicare.  [name] is currently receiving skilled therapy at [facility].   Telephone call made to member's husband [name] [phone]. No answer. HIPAA compliant voicemail message left. Attempted to call [phone]. Rang busy.   Will plan outreach again to discuss [facility] Care Management services for post SNF stay.  [name], MSN-Ed, RN,BSN [facility] Post Acute Care Coordinator [phone] [facility]) [phone]  (Toll free office)

## 2019-07-12 ENCOUNTER — Other Ambulatory Visit: Payer: Self-pay | Admitting: *Deleted

## 2019-07-12 DIAGNOSIS — I10 Essential (primary) hypertension: Secondary | ICD-10-CM
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
## 2019-07-17 ENCOUNTER — Other Ambulatory Visit: Payer: Self-pay | Admitting: *Deleted

## 2019-07-17 DIAGNOSIS — M79671 Pain in right foot: Secondary | ICD-10-CM | POA: Diagnosis not present

## 2019-07-17 NOTE — Patient Outreach (Signed)
[facility] Care Management  07/17/2019  KACELYN ROWZEE 1940-06-07 [id number]   Referral received 07/13/2019 Initial Outreach 07/17/2019  Telephone Assessment  RN spoke with pt's spouse who indicated pt was not able to take the call. Requested call back in the morning around 1130.  PLAN: Will follow up tomorrow as requested.  Elliot Cousin, RN Care Management Coordinator [facility] Main Office [phone]

## 2019-07-18 ENCOUNTER — Encounter: Payer: Self-pay | Admitting: *Deleted

## 2019-07-18 ENCOUNTER — Other Ambulatory Visit: Payer: Self-pay | Admitting: *Deleted

## 2019-07-18 NOTE — Patient Outreach (Signed)
[facility]) Care Management  07/18/2019  [name] 12-Apr-1941 [id number]    Telephone Assessment-Successful-Enrolled Post of Discharge from SNF   RN spoke with pt and introduced [facility] services and the purpose for today's call. Pt receptive and was enrolled into the Fall prevention program based upon a recent hip surgery due to a fall. Pt very detail with the information provided and RN was able to complete the initial assessment and obtain information for entering in the Fall prevention program. Pt informed of other services available if needed for social work and/or pharmacy needs. Pt has indicated her son works for a DME company and replaced and update all her DME as noted. Pt has a supportive spouse in the home and a son and daughter who sleeps over every night at pt continue to recover. Pt states her home is small so she has opted for out patient therapy and will start with her sessions on 3/2. Pt has also verified upcoming appointments with all her providers that will take place in March.  Further discussed on fall prevention as RN inquired on safety measures in the home with removal of rugs, use of DME, safety measures in the bathrooms and removal of tables and furniture that pt would be at risk to injuries. Pt reports her son has added rales both inside and outside the home to prevent falls where there are steps.   Plan of care generated with long and short terms goals discussed. Along with interventions and tools that will be provided accordingly. Pt aware her provider will be updated concerning her disposition with [facility] services. Pt does not wish for week transition of care call due to her out patient therapy appointments. RN will follow up monthly for care management services. Pt provided contact number for any inquires or request prior to the follow up call next month if needed.  THN CM Care Plan Problem One     Most Recent Value  Care Plan Problem One  Impaired  safety-Recent fall  Role Documenting the Problem One  Care Management Telephonic Coordinator  Care Plan for Problem One  Active  THN Long Term Goal   Pt will not susutain a fall within the next 90 days  THN Long Term Goal Start Date  07/18/19  Interventions for Problem One Long Term Goal  WIll stress the use of her DME to prevent falls and mail out educational information to assist with this task.  THN CM Short Term Goal #1   Pt will work with the physcial therapist on fall prevention methods with the next 30 days  THN CM Short Term Goal #1 Start Date  07/18/19  Interventions for Short Term Goal #1  Will verify pt has arrange appointments with therapist for out pt services that will began next week. Strongly encouraged pt participation for strengthening and increase endurance with these exercises.  THN CM Short Term Goal #2   Adherence with all scheduled medical appointments within the next 30 days  THN CM Short Term Goal #2 Start Date  07/18/19  Interventions for Short Term Goal #2  Will verify pt has sufficient transportation to all pending medical appointments and RN will also provider of sources if needed for transport resources. Will educate pt to contact the office to reschedule if needed to avoid possible fees.      Elliot Cousin, RN Care Management Coordinator [facility] Main Office [phone]

## 2019-07-24 DIAGNOSIS — D696 Thrombocytopenia, unspecified: Secondary | ICD-10-CM | POA: Diagnosis not present

## 2019-07-24 DIAGNOSIS — Z8781 Personal history of (healed) traumatic fracture: Secondary | ICD-10-CM | POA: Diagnosis not present

## 2019-07-24 DIAGNOSIS — M810 Age-related osteoporosis without current pathological fracture: Secondary | ICD-10-CM | POA: Diagnosis not present

## 2019-07-25 DIAGNOSIS — M25552 Pain in left hip: Secondary | ICD-10-CM | POA: Insufficient documentation

## 2019-07-27 DIAGNOSIS — M25552 Pain in left hip: Secondary | ICD-10-CM | POA: Diagnosis not present

## 2019-08-01 DIAGNOSIS — M25552 Pain in left hip: Secondary | ICD-10-CM | POA: Diagnosis not present

## 2019-08-04 DIAGNOSIS — M25552 Pain in left hip: Secondary | ICD-10-CM | POA: Diagnosis not present

## 2019-08-08 DIAGNOSIS — M25552 Pain in left hip: Secondary | ICD-10-CM | POA: Diagnosis not present

## 2019-08-11 DIAGNOSIS — S72032D Displaced midcervical fracture of left femur, subsequent encounter for closed fracture with routine healing: Secondary | ICD-10-CM | POA: Diagnosis not present

## 2019-08-11 DIAGNOSIS — M25552 Pain in left hip: Secondary | ICD-10-CM | POA: Diagnosis not present

## 2019-08-15 DIAGNOSIS — M25552 Pain in left hip: Secondary | ICD-10-CM | POA: Diagnosis not present

## 2019-08-16 ENCOUNTER — Other Ambulatory Visit: Payer: Self-pay | Admitting: *Deleted

## 2019-08-16 NOTE — Patient Outreach (Signed)
[facility]) Care Management  08/16/2019  LIORA MYLES 1941-01-31 [id number]    Telephone Assessment-Unsuccessful  RN attempted outreach call today however unsuccessful. RN able to leave a HIPAA approved voice message requesting a call back.   Plan: Will scheduled another outreach call over the next week for an update.   Elliot Cousin, RN Care Management Coordinator [facility] Main Office [phone]

## 2019-08-18 DIAGNOSIS — M25552 Pain in left hip: Secondary | ICD-10-CM | POA: Diagnosis not present

## 2019-08-21 ENCOUNTER — Ambulatory Visit: Payer: MEDICARE | Attending: Internal Medicine

## 2019-08-21 DIAGNOSIS — Z23 Encounter for immunization: Secondary | ICD-10-CM

## 2019-08-21 NOTE — Progress Notes (Signed)
   Covid-19 Vaccination Clinic  Name:  [name]    MRN: [id number] DOB: 06-28-1940  08/21/2019  [name] was observed post Covid-19 immunization for 15 minutes without incident. She was provided with Vaccine Information Sheet and instruction to access the V-Safe system.   [name] was instructed to call 911 with any severe reactions post vaccine: Marland Kitchen Difficulty breathing  . Swelling of face and throat  . A fast heartbeat  . A bad rash all over body  . Dizziness and weakness   Immunizations Administered    Name Date Dose VIS Date Route   Pfizer COVID-19 Vaccine 08/21/2019 10:19 AM 0.3 mL 05/05/2019 Intramuscular   Manufacturer: [employer], [employer]   Lot: [id number]   NDC: [address]-5

## 2019-08-22 ENCOUNTER — Other Ambulatory Visit: Payer: Self-pay | Admitting: *Deleted

## 2019-08-22 NOTE — Patient Outreach (Signed)
[facility]) Care Management  08/22/2019  [name] [birth year] [id number]   Telephone Assessment-Prevention Measures (Falls)  RN spoke with pt today and further inquired on her ongoing management of care. Pt reports she is doing very well with no falls or related injuries. States she has been attending all medical appointments and three of her providers have extended her appointments out 6 months or more due to her progress. Reports she continue with PT services on an outpatient level at least through 4/15. Then she will be re-evaluated to see if more sessions are needed at that time. Currently participating twice weekly with no falls or issues.   Plan of care discussed and updated accordingly based upon pt's progress. Will reiterate on the goals and adjust the interventions accordingly based upon today's report. Will continue to encourage adherence as pt continue to manage her care independently. Will continue to communicate with pt's provider on her ongoing progress.  THN CM Care Plan Problem One     Most Recent Value  Care Plan Problem One  Impaired safety-Recent fall  Role Documenting the Problem One  Care Management Telephonic Coordinator  Care Plan for Problem One  Active  THN Long Term Goal   Pt will not susutain a fall within the next 90 days  THN Long Term Goal Start Date  07/18/19  Interventions for Problem One Long Term Goal  Will verifiy no falls or related injuiries. Will continue to encouraged pt to work with the PT weekly as prscribed and scheduled. Will re-evaluate pt's participate with her therapist next month and educate accordingly concerning ongoing safety both inside and outside the home with use of her DME.  [facility] CM Short Term Goal #1   Pt will work with the physcial therapist on fall prevention methods with the next 30 days  [facility]N CM Short Term Goal #1 Start Date  07/18/19  Interventions for Short Term Goal #1  Will continue to verify pt continues to  work with PT 2 days weekly over the next month, Pt will be re-evaluated mid April to determine if more therapy is needed. WIll extend this goals based upon this information.  THN CM Short Term Goal #2   Adherence with all scheduled medical appointments within the next 30 days  THN CM Short Term Goal #2 Start Date  07/18/19  [facility] CM Short Term Goal #2 Met Date  08/22/19      Raina Mina, RN Care Management Coordinator [place] Office [phone]

## 2019-08-23 DIAGNOSIS — M25552 Pain in left hip: Secondary | ICD-10-CM | POA: Diagnosis not present

## 2019-08-25 DIAGNOSIS — M25552 Pain in left hip: Secondary | ICD-10-CM | POA: Diagnosis not present

## 2019-08-29 DIAGNOSIS — M25552 Pain in left hip: Secondary | ICD-10-CM | POA: Diagnosis not present

## 2019-09-01 DIAGNOSIS — M25552 Pain in left hip: Secondary | ICD-10-CM | POA: Diagnosis not present

## 2019-09-13 ENCOUNTER — Ambulatory Visit: Payer: MEDICARE | Attending: Internal Medicine

## 2019-09-13 DIAGNOSIS — Z23 Encounter for immunization: Secondary | ICD-10-CM

## 2019-09-13 NOTE — Progress Notes (Signed)
   Covid-19 Vaccination Clinic  Name:  [name]    MRN: [id number] DOB: [birth year]  09/13/2019  [name] was observed post Covid-19 immunization for 15 minutes without incident. She was provided with Vaccine Information Sheet and instruction to access the V-Safe system.   [name] was instructed to call 911 with any severe reactions post vaccine: Marland Kitchen Difficulty breathing  . Swelling of face and throat  . A fast heartbeat  . A bad rash all over body  . Dizziness and weakness   Immunizations Administered    Name Date Dose VIS Date Route   Pfizer COVID-19 Vaccine 09/13/2019  9:05 AM 0.3 mL 07/19/2018 Intramuscular   Manufacturer: [employer], [employer]   Lot: [id number]   NDC: [address]-9

## 2019-09-22 ENCOUNTER — Other Ambulatory Visit: Payer: Self-pay | Admitting: *Deleted

## 2019-09-22 NOTE — Patient Outreach (Signed)
[facility]) Care Management  09/22/2019  [name] 1940/06/09 [id number]   Telephone Assessment-Unsuccessful  RN attempted outreach  Today however unsuccessful. RN able to leave a HIPAA approved voice message requesting a call back.  Plan: Will scheduled another outreach next week for ongoing services.  Elliot Cousin, RN Care Management Coordinator [facility] Main Office [phone]

## 2019-09-28 ENCOUNTER — Other Ambulatory Visit: Payer: Self-pay | Admitting: *Deleted

## 2019-09-28 NOTE — Patient Outreach (Signed)
[facility]) Care Management  09/28/2019  [name] Jun 16, 1940 [id number]   Telephone Assessment-Successful  RN spoke with pt today and received an update on her ongoing management of care. Pt denies any falls or injuries and continues to do well. States she has completed PT therapy and continue to prefrom the requested exercises to maintain her strength. States her spouse assist and participates with her during the exercises. Reports she has stationary bike and her son has purchased services for both her and her spouse for a medical alert necklace for any emergencies inside the home.  RN discussed the current plan of care with all goals met and pt states her spouse continues to drive her to all her medical appointments with no encountered problems. Reports her upcoming appointments with primary on Monday and ortho in July. No other inquires or request at this time.   RN inquired on her HTN and offered a health coach for ongoing education and management of care via [facility] services. Pt receptive and agreed to ongoing [facility] services with a Health Coach for quarterly calls.  Will make this referral and alert provider accordingly.  TH[facility] CM Care Plan Problem One     Most Recent Value  Care Plan Problem One  Impaired safety-Recent fall  Role Documenting the Problem One  Care Management Telephonic [place] for Problem One  Active  THN Long Term Goal   Pt will not susutain a fall within the next 90 days  THN Long Term Goal Start Date  07/18/19  [facility] Long Term Goal Met Date  09/28/19  THN CM Short Term Goal #1   Pt will work with the [place] therapist on fall prevention methods with the next 30 days  THN CM Short Term Goal #1 Start Date  07/18/19  [facility] CM Short Term Goal #1 Met Date  09/28/19      Raina Mina, RN Care Management Coordinator [place] Office [phone]

## 2019-10-02 ENCOUNTER — Other Ambulatory Visit: Payer: Self-pay | Admitting: *Deleted

## 2019-10-02 DIAGNOSIS — I10 Essential (primary) hypertension: Secondary | ICD-10-CM | POA: Diagnosis not present

## 2019-10-02 DIAGNOSIS — K219 Gastro-esophageal reflux disease without esophagitis: Secondary | ICD-10-CM | POA: Diagnosis not present

## 2019-10-02 DIAGNOSIS — R6889 Other general symptoms and signs: Secondary | ICD-10-CM | POA: Diagnosis not present

## 2019-10-02 DIAGNOSIS — N1831 Chronic kidney disease, stage 3a: Secondary | ICD-10-CM | POA: Diagnosis not present

## 2019-10-02 DIAGNOSIS — M810 Age-related osteoporosis without current pathological fracture: Secondary | ICD-10-CM | POA: Diagnosis not present

## 2019-10-12 DIAGNOSIS — M810 Age-related osteoporosis without current pathological fracture: Secondary | ICD-10-CM | POA: Diagnosis not present

## 2019-10-17 ENCOUNTER — Other Ambulatory Visit: Payer: Self-pay | Admitting: *Deleted

## 2019-10-17 ENCOUNTER — Encounter: Payer: Self-pay | Admitting: *Deleted

## 2019-10-17 NOTE — Patient Outreach (Signed)
[facility]) Care Management  [facility] Care Manager  10/17/2019   [name] 1940-11-27 [id number]   RN Health Coach Initial Assessment  Referral Date:  09/28/2019 Referral Source:  Transfer from [facility] Care Coordinator Reason for Referral:  Continued Disease Management Education Insurance:  Medicare   Outreach Attempt:  Successful telephone outreach to patient for introduction and initial telephone assessment.  HIPAA verified with patient.  RN Health Coach introduced self and role.  Patient verbally agrees to Disease Management outreaches.  Patient completed initial telephone assessment.  Social:  Patient lives at home with husband and daughter.  Reports about 3 falls in the last year.  Last fall in January 2021 resulting in hip fracture.  Continues to use walker to ambulate.  Reports being independent with ADLs and family/husband assisting with IADLs.  Husband transports to medical appointments.  DME in the home include:  Rolling walker, Rolator walker, quad cane, upper dentures, bedside commode, shower chair, grab bars in the shower, reading glasses, and wheelchair.  Conditions:  Per chart review and discussion with patient, PMH include but not limited to:  GERD, hypertension, irritable bowel syndrome, osteopenia, bilateral cataract extractions, left hip fracture with repair, and left knee replacement.  Patient reporting she is healed from recent left hip repair and denies any falls since fracture.  Continues to ambulate with Rolator walker to allow rest breaks with cooking.  Denies any recent emergency room visits or hospitalizations.  Reports she now monitors her blood pressures about twice a week.  States her blood pressures have ranged 120-130/60-80's.  Denies any hypertensive or hypotensive episodes.  Medications:  Reports taking about 5 medications daily.  States her husband helps her manage her medications.  Denies any issues affording medications at this  time.  Encounter Medications:  Outpatient Encounter Medications as of 10/17/2019  Medication Sig Note  . acetaminophen (TYLENOL) 500 MG tablet Take 1,000 mg by mouth every 6 (six) hours as needed for mild pain or moderate pain.   Marland Kitchen amLODipine (NORVASC) 5 MG tablet Take 5 mg by mouth daily.   Marland Kitchen aspirin 81 MG EC tablet daily.   . Cholecalciferol (VITAMIN D3 PO) Take 1 tablet by mouth at bedtime.   Marland Kitchen esomeprazole (NEXIUM) 40 MG capsule Take 40 mg by mouth daily.    . Multiple Vitamins-Minerals (MULTIVITAMIN WITH MINERALS) tablet Take 1 tablet by mouth daily.   Marland Kitchen alendronate (FOSAMAX) 70 MG tablet Take 70 mg by mouth once a week. Thursday 10/17/2019: Reports no longer taking  . amLODipine (NORVASC) 2.5 MG tablet Take 1 tablet (2.5 mg total) by mouth daily. (Patient not taking: Reported on 10/17/2019) 10/17/2019: Reports taking 5 mg daily  . HYDROcodone-acetaminophen (NORCO/VICODIN) 5-325 MG tablet Take 1 tablet by mouth every 4 (four) hours as needed for moderate pain. (Patient not taking: Reported on 07/18/2019)   . methocarbamol (ROBAXIN) 500 MG tablet Take 1 tablet (500 mg total) by mouth every 6 (six) hours as needed for muscle spasms. (Patient not taking: Reported on 07/18/2019)   . ondansetron (ZOFRAN ODT) 4 MG disintegrating tablet Take 1 tablet (4 mg total) by mouth every 8 (eight) hours as needed for nausea or vomiting. (Patient not taking: Reported on 06/20/2019)   . polyethylene glycol (MIRALAX / GLYCOLAX) 17 g packet Take 17 g by mouth daily as needed for mild constipation. (Patient not taking: Reported on 07/18/2019)    No facility-administered encounter medications on file as of 10/17/2019.    Functional Status:  In your present  state of health, do you have any difficulty performing the following activities: 10/17/2019 07/18/2019  Hearing? N N  Vision? N N  Difficulty concentrating or making decisions? N N  Walking or climbing stairs? Tempie Donning  Comment difficulties climbing stairs with walker  DME and fx available to assist  Dressing or bathing? N N  Doing errands, shopping? Tempie Donning  Comment family runs errands Family assist this Garment/textile technologist and eating ? Y N  Comment husband helps cook -  Using the Toilet? N N  In the past six months, have you accidently leaked urine? N N  Do you have problems with loss of bowel control? N N  Managing your Medications? Y N  Comment husband helps with managing medications -  Managing your Finances? Tempie Donning  Comment husband helps with finances Family assist with this task  Housekeeping or managing your Housekeeping? Y Y  Comment kids and husband does cleaning Family assist with this task.  Some recent data might be hidden    Fall/Depression Screening: Fall Risk  10/17/2019 07/18/2019  Falls in the past year? 1 1  Number falls in past yr: 1 0  Comment Last fall January 2021 with hip fracture -  Injury with Fall? 1 1  Risk for fall due to : History of fall(s);Impaired balance/gait;Impaired mobility;Medication side effect;Orthopedic patient History of fall(s);Impaired mobility  Follow up Falls prevention discussed;Education provided;Falls evaluation completed Education provided;Falls prevention discussed   PHQ 2/9 Scores 10/17/2019 07/18/2019  PHQ - 2 Score 0 0   THN CM Care Plan Problem One     Most Recent Value  Care Plan Problem One  Knowledge deficit related to self care management of hypertension  Role Documenting the Problem One  [place] for Problem One  Active  [facility] Long Term Goal   Patient will report no emergency room visits within the next 90 days.  THN Long Term Goal Start Date  10/17/19  Interventions for Problem One Long Term Goal  Care plan and goals reviewed and discussed, reviewed medications and indications and encouraged medication compliance, encouraged to continue to monitor blood pressures twice a week per provider instructions and to notify provider for sustained elevations, reviewed signs and symptoms of  hypertension, fall precautions and preventions reviewed and discussed, encouraged to use walker with all ambulation and to use Rolator seat when getting tired, encouraged to keep and attend scheduled medical appointments     Appointments:  Patient attended appointment with primary care provider, Dr. Laurann Montana on 10/02/2019.  Advanced Directives:  Patient reports having a Living Will in place and does not wish to make any changes at this time.   Consent:  [facility] services reviewed and discussed.  Patient verbally agrees to Disease Management outreaches.  Plan: RN Health Coach will send primary MD barriers letter. RN Health Coach will route initial telephone assessment note to primary MD. [place] will send patient A Matter of Choice Blood Pressure Control Booklet. RN Health Coach will make next telephone outreach to patient in the month of August and patient agrees to future outreach.  [place] [phone] [email or web address]

## 2019-10-24 ENCOUNTER — Other Ambulatory Visit (HOSPITAL_COMMUNITY): Payer: Self-pay

## 2019-10-25 ENCOUNTER — Other Ambulatory Visit: Payer: Self-pay

## 2019-10-25 ENCOUNTER — Ambulatory Visit (HOSPITAL_COMMUNITY)
Admission: RE | Admit: 2019-10-25 | Discharge: 2019-10-25 | Disposition: A | Discharge status: Home / Self Care | Payer: MEDICARE | Source: Ambulatory Visit | Attending: Internal Medicine | Admitting: Internal Medicine

## 2019-10-25 DIAGNOSIS — M810 Age-related osteoporosis without current pathological fracture: Secondary | ICD-10-CM | POA: Diagnosis not present

## 2019-10-25 MED ORDER — ZOLEDRONIC ACID 5 MG/100ML IV SOLN
5.0000 mg | Freq: Once | INTRAVENOUS | Status: AC
  Administered 2019-10-25: 5 mg via INTRAVENOUS

## 2019-10-25 MED ORDER — ZOLEDRONIC ACID 5 MG/100ML IV SOLN
INTRAVENOUS | Status: AC
  Filled 2019-10-25: qty 100

## 2019-10-25 NOTE — Discharge Instructions (Signed)

## 2019-12-18 DIAGNOSIS — S72032D Displaced midcervical fracture of left femur, subsequent encounter for closed fracture with routine healing: Secondary | ICD-10-CM | POA: Diagnosis not present

## 2019-12-28 DIAGNOSIS — M1711 Unilateral primary osteoarthritis, right knee: Secondary | ICD-10-CM | POA: Diagnosis not present

## 2020-01-02 ENCOUNTER — Other Ambulatory Visit: Payer: Self-pay | Admitting: *Deleted

## 2020-01-02 NOTE — Patient Outreach (Signed)
[facility]) Care Management  01/02/2020  KARILYNN CARRANZA [birth year] [id number]   RN Health Coach Quarterly Outreach  Referral Date:  09/28/2019 Referral Source:  Transfer from [facility] Care Coordinator Reason for Referral:  Continued Disease Management Education Insurance:  Medicare   Outreach Attempt:  Outreach attempt #1 to patient for follow up. No answer and unable to leave voicemail message due to voicemail box not set up.  Plan:  RN Health Coach will make another outreach attempt within the month of September if no return call back from patient.   Rhae Lerner RN [facility] Care Management  RN Health Coach [phone] [email or web address]

## 2020-01-04 DIAGNOSIS — M1711 Unilateral primary osteoarthritis, right knee: Secondary | ICD-10-CM | POA: Diagnosis not present

## 2020-01-11 DIAGNOSIS — M1711 Unilateral primary osteoarthritis, right knee: Secondary | ICD-10-CM | POA: Diagnosis not present

## 2020-02-08 ENCOUNTER — Other Ambulatory Visit: Payer: Self-pay | Admitting: *Deleted

## 2020-02-08 NOTE — Patient Outreach (Signed)
[facility]) Care Management  02/08/2020  [name] [birth year] [id number]   RN Health Coach Quarterly Outreach  Referral Date: 09/28/2019 Referral Source: Transfer from [facility] Care Coordinator Reason for Referral: Continued Disease Management Education Insurance: Medicare   Outreach Attempt:  Outreach attempt #2 to patient for follow up. No answer and unable to leave voicemail message due to voicemail box not being set up.  Plan: RN Health Coach will make another outreach attempt within the month of October if no return call back from patient.    Rhae Lerner RN [facility] Care Management  RN Health Coach [phone] [email or web address]

## 2020-03-14 ENCOUNTER — Other Ambulatory Visit: Payer: Self-pay | Admitting: *Deleted

## 2020-03-14 ENCOUNTER — Encounter: Payer: Self-pay | Admitting: *Deleted

## 2020-03-14 NOTE — Patient Instructions (Addendum)
Goals Addressed            This Visit's Progress   . [facility]) Learn More About My Health       Follow Up Date 06/24/20   - tell my story and reason for my visit - make a list of questions - ask questions - repeat what I heard to make sure I understand - bring a list of my medicines to the visit - speak up when I don't understand    Why is this important?   The best way to learn about your health and care is by talking to the doctor and nurse.  They will answer your questions and give you information in the way that you like best.    Notes:     . [facility]) Make and Keep All Appointments       Follow Up Date 06/24/20   - ask family or friend for a ride - call to cancel if needed - keep a calendar with appointment dates    Why is this important?   Part of staying healthy is seeing the doctor for follow-up care.  If you forget your appointments, there are some things you can do to stay on track.    Notes:     . [facility]) Prevent Falls and Injury       Follow Up Date 09/20/20   - always wear low-heeled or flat shoes or slippers with nonskid soles - make an emergency alert plan in case I fall - use a cane or walker -will continue to use rolling walker and Rolator walker with all ambulation     Why is this important?   Most falls happen when it is hard for you to walk safely. Your balance may be off because of an illness. You may have pain in your knees, hip or other joints.  You may be overly tired or taking medicines that make you sleepy. You may not be able to see or hear clearly.  Falls can lead to broken bones, bruises or other injuries.  There are things you can do to help prevent falling.     Notes:     . [facility]) Track and Manage My Blood Pressure       Follow Up Date 09/20/20   - check blood pressure weekly - choose a place to take my blood pressure (home, clinic or office, retail store) - write blood pressure results in a log or diary    Why is this important?   You won't  feel high blood pressure, but it can still hurt your blood vessels.  High blood pressure can cause heart or kidney problems. It can also cause a stroke.  Making lifestyle changes like losing a little weight or eating less salt will help.  Checking your blood pressure at home and at different times of the day can help to control blood pressure.  If the doctor prescribes medicine remember to take it the way the doctor ordered.  Call the office if you cannot afford the medicine or if there are questions about it.     Notes:

## 2020-03-14 NOTE — Patient Outreach (Addendum)
[facility]) Care Management  [place] Care Manager  03/14/2020   [name] 1941/04/30 [id number]   RN Health CoachQuarterlyOutreach  Referral Date: 09/28/2019 Referral Source: Transfer from [facility] Care Coordinator Reason for Referral: Continued Disease Management Education Insurance: Medicare   Outreach Attempt:  Successful telephone outreach to patient for follow up.  HIPAA verified with patient.  Patient reporting she is doing well.  Reports continues to use walker for ambulation and denies any recent falls.  Denies any recent sick days.  Reports monitoring blood pressures weekly with ranges of 120-130/60-80's.  Denies any episodes of hyper or hypotension.  Encounter Medications:  Outpatient Encounter Medications as of 03/14/2020  Medication Sig Note  . acetaminophen (TYLENOL) 500 MG tablet Take 1,000 mg by mouth every 6 (six) hours as needed for mild pain or moderate pain.   Marland Kitchen amLODipine (NORVASC) 5 MG tablet Take 5 mg by mouth daily.   Marland Kitchen aspirin 81 MG EC tablet daily.   . Cholecalciferol (VITAMIN D3 PO) Take 1 tablet by mouth at bedtime.   Marland Kitchen esomeprazole (NEXIUM) 40 MG capsule Take 40 mg by mouth daily.    . Multiple Vitamins-Minerals (MULTIVITAMIN WITH MINERALS) tablet Take 1 tablet by mouth daily.   Marland Kitchen alendronate (FOSAMAX) 70 MG tablet Take 70 mg by mouth once a week. Thursday (Patient not taking: Reported on 03/14/2020) 10/17/2019: Reports no longer taking  . amLODipine (NORVASC) 2.5 MG tablet Take 1 tablet (2.5 mg total) by mouth daily. (Patient not taking: Reported on 10/17/2019) 10/17/2019: Reports taking 5 mg daily  . HYDROcodone-acetaminophen (NORCO/VICODIN) 5-325 MG tablet Take 1 tablet by mouth every 4 (four) hours as needed for moderate pain. (Patient not taking: Reported on 07/18/2019)   . methocarbamol (ROBAXIN) 500 MG tablet Take 1 tablet (500 mg total) by mouth every 6 (six) hours as needed for muscle spasms. (Patient not taking: Reported on  07/18/2019)   . ondansetron (ZOFRAN ODT) 4 MG disintegrating tablet Take 1 tablet (4 mg total) by mouth every 8 (eight) hours as needed for nausea or vomiting. (Patient not taking: Reported on 06/20/2019)   . polyethylene glycol (MIRALAX / GLYCOLAX) 17 g packet Take 17 g by mouth daily as needed for mild constipation. (Patient not taking: Reported on 07/18/2019)    No facility-administered encounter medications on file as of 03/14/2020.    Functional Status:  In your present state of health, do you have any difficulty performing the following activities: 10/17/2019 07/18/2019  Hearing? N N  Vision? N N  Difficulty concentrating or making decisions? N N  Walking or climbing stairs? Malvin Johns  Comment difficulties climbing stairs with walker DME and fx available to assist  Dressing or bathing? N N  Doing errands, shopping? Malvin Johns  Comment family runs errands Family assist this Camera operator and eating ? Y N  Comment husband helps cook -  Using the Toilet? N N  In the past six months, have you accidently leaked urine? N N  Do you have problems with loss of bowel control? N N  Managing your Medications? Y N  Comment husband helps with managing medications -  Managing your Finances? Malvin Johns  Comment husband helps with finances Family assist with this task  Housekeeping or managing your Housekeeping? Y Y  Comment kids and husband does cleaning Family assist with this task.  Some recent data might be hidden    Fall/Depression Screening: Fall Risk  03/14/2020 10/17/2019 07/18/2019  Falls in the past year?  1 1 1   Number falls in past yr: 1 1 0  Comment last fall January 2021 with hip fracture Last fall January 2021 with hip fracture -  Injury with Fall? 1 1 1   Risk for fall due to : History of fall(s);Impaired balance/gait;Impaired mobility;Medication side effect;Orthopedic patient History of fall(s);Impaired balance/gait;Impaired mobility;Medication side effect;Orthopedic patient History of  fall(s);Impaired mobility  Follow up Falls prevention discussed;Education provided;Falls evaluation completed Falls prevention discussed;Education provided;Falls evaluation completed Education provided;Falls prevention discussed   PHQ 2/9 Scores 10/17/2019 07/18/2019  PHQ - 2 Score 0 0    Goals Addressed            This Visit's Progress   . [facility]) Learn More About My Health       Follow Up Date 06/24/20   - tell my story and reason for my visit - make a list of questions - ask questions - repeat what I heard to make sure I understand - bring a list of my medicines to the visit - speak up when I don't understand    Why is this important?   The best way to learn about your health and care is by talking to the doctor and nurse.  They will answer your questions and give you information in the way that you like best.    Notes:     . [facility]) Make and Keep All Appointments       Follow Up Date 06/24/20   - ask family or friend for a ride - call to cancel if needed - keep a calendar with appointment dates    Why is this important?   Part of staying healthy is seeing the doctor for follow-up care.  If you forget your appointments, there are some things you can do to stay on track.    Notes:     . [facility]) Prevent Falls and Injury       Follow Up Date 09/20/20   - always wear low-heeled or flat shoes or slippers with nonskid soles - make an emergency alert plan in case I fall - use a cane or walker -will continue to use rolling walker and Rolator walker with all ambulation     Why is this important?   Most falls happen when it is hard for you to walk safely. Your balance may be off because of an illness. You may have pain in your knees, hip or other joints.  You may be overly tired or taking medicines that make you sleepy. You may not be able to see or hear clearly.  Falls can lead to broken bones, bruises or other injuries.  There are things you can do to help prevent falling.      Notes:     . [facility]) Track and Manage My Blood Pressure       Follow Up Date 09/20/20   - check blood pressure weekly - choose a place to take my blood pressure (home, clinic or office, retail store) - write blood pressure results in a log or diary    Why is this important?   You won't feel high blood pressure, but it can still hurt your blood vessels.  High blood pressure can cause heart or kidney problems. It can also cause a stroke.  Making lifestyle changes like losing a little weight or eating less salt will help.  Checking your blood pressure at home and at different times of the day can help to control blood pressure.  If  the doctor prescribes medicine remember to take it the way the doctor ordered.  Call the office if you cannot afford the medicine or if there are questions about it.     Notes:       Appointments: Has scheduled appointment with primary care provider, Dr. Valentina Lucks on 04/03/2020.  Plan: RN Health Coach will send primary care provider quarterly update. RN Health Coach will make next telephone outreach to patient within the month of January and patient agrees to future outreach. RN Health Coach will send Understanding Your Risk for Falls Education.  Rhae Lerner RN [facility] Care Management  RN Health Coach [phone] [email or web address]

## 2020-04-08 DIAGNOSIS — Z1389 Encounter for screening for other disorder: Secondary | ICD-10-CM | POA: Diagnosis not present

## 2020-04-08 DIAGNOSIS — I10 Essential (primary) hypertension: Secondary | ICD-10-CM | POA: Diagnosis not present

## 2020-04-08 DIAGNOSIS — Z8781 Personal history of (healed) traumatic fracture: Secondary | ICD-10-CM | POA: Diagnosis not present

## 2020-04-08 DIAGNOSIS — N1831 Chronic kidney disease, stage 3a: Secondary | ICD-10-CM | POA: Diagnosis not present

## 2020-04-08 DIAGNOSIS — M810 Age-related osteoporosis without current pathological fracture: Secondary | ICD-10-CM | POA: Diagnosis not present

## 2020-04-08 DIAGNOSIS — Z23 Encounter for immunization: Secondary | ICD-10-CM | POA: Diagnosis not present

## 2020-04-08 DIAGNOSIS — K219 Gastro-esophageal reflux disease without esophagitis: Secondary | ICD-10-CM | POA: Diagnosis not present

## 2020-04-08 DIAGNOSIS — Z0000 Encounter for general adult medical examination without abnormal findings: Secondary | ICD-10-CM | POA: Diagnosis not present

## 2020-04-26 ENCOUNTER — Ambulatory Visit: Payer: MEDICARE | Attending: Internal Medicine

## 2020-04-26 DIAGNOSIS — Z23 Encounter for immunization: Secondary | ICD-10-CM

## 2020-04-26 NOTE — Progress Notes (Signed)
   Covid-19 Vaccination Clinic  Name:  [name]    MRN: [id number] DOB: 07-25-1940  04/26/2020  [name] was observed post Covid-19 immunization for 15 minutes without incident. She was provided with Vaccine Information Sheet and instruction to access the V-Safe system.   [name] was instructed to call 911 with any severe reactions post vaccine: Marland Kitchen Difficulty breathing  . Swelling of face and throat  . A fast heartbeat  . A bad rash all over body  . Dizziness and weakness   Immunizations Administered    Name Date Dose VIS Date Route   Pfizer COVID-19 Vaccine 04/26/2020  1:20 PM 0.3 mL 03/13/2020 Intramuscular   Manufacturer: [employer], [employer]   Lot: [id number]   NDC: [address]-4

## 2020-06-03 ENCOUNTER — Other Ambulatory Visit: Payer: Self-pay | Admitting: *Deleted

## 2020-06-03 NOTE — Patient Outreach (Signed)
[facility]) Care Management  06/03/2020  [name] 07/31/1940 [id number]  Outreach attempt to patient. No answer and unable to leave voicemail message due to message stating voicemail has not been activated.  Plan: RN Health Coach will call patient within the month of February.  [name] RN, BSN [facility] Care Management  RN Health Coach [phone] [email or web address]

## 2020-06-13 DIAGNOSIS — Z780 Asymptomatic menopausal state: Secondary | ICD-10-CM | POA: Diagnosis not present

## 2020-06-13 DIAGNOSIS — M85831 Other specified disorders of bone density and structure, right forearm: Secondary | ICD-10-CM | POA: Diagnosis not present

## 2020-06-13 DIAGNOSIS — Z1231 Encounter for screening mammogram for malignant neoplasm of breast: Secondary | ICD-10-CM | POA: Diagnosis not present

## 2020-06-13 DIAGNOSIS — M810 Age-related osteoporosis without current pathological fracture: Secondary | ICD-10-CM | POA: Diagnosis not present

## 2020-06-18 DIAGNOSIS — S72032D Displaced midcervical fracture of left femur, subsequent encounter for closed fracture with routine healing: Secondary | ICD-10-CM | POA: Diagnosis not present

## 2020-07-17 ENCOUNTER — Other Ambulatory Visit: Payer: Self-pay | Admitting: *Deleted

## 2020-07-17 NOTE — Patient Outreach (Signed)
[facility]) Care Management  07/17/2020  [name] [birth year] [id number]  Outreach attempt to patient. No answer and unable to leave voicemail message due to called patient's number x2 and the phone picked up, no one spoke or replied when nurse spoke, and then the phone hung up without giving an opportunity to leave a voice message.   Plan: RN Health Coach will call patient within the month of March and will mail the patient a Administrator, arts.   [name] RN, BSN [facility] Care Management  RN Health Coach [phone] [name]

## 2020-08-07 ENCOUNTER — Other Ambulatory Visit: Payer: Self-pay | Admitting: *Deleted

## 2020-08-07 NOTE — Patient Outreach (Signed)
[facility]) Care Management  08/07/2020  [name] Mar 28, 1941 [id number]  Successful telephone outreach call to patient. HIPAA identifiers obtained. Patient spoke with nurse for several minutes and states that currently she is doing extremely well. She has recovered from her femur fracture and is back to maintaining her home. Patient states she is very well supported by her husband and family and her hypertension is well controlled. Patient added that she did not feel that she needed [facility] services at this time. Patient did explain that if anything happens and she needs services in the future she will contact [facility].   Plan: RN Health Coach will close case.  [name] RN, BSN [facility] Care Management  RN Health Coach [phone] [email or web address]

## 2020-10-07 DIAGNOSIS — K219 Gastro-esophageal reflux disease without esophagitis: Secondary | ICD-10-CM | POA: Diagnosis not present

## 2020-10-07 DIAGNOSIS — I10 Essential (primary) hypertension: Secondary | ICD-10-CM | POA: Diagnosis not present

## 2020-10-07 DIAGNOSIS — M810 Age-related osteoporosis without current pathological fracture: Secondary | ICD-10-CM | POA: Diagnosis not present

## 2020-10-09 ENCOUNTER — Ambulatory Visit: Payer: MEDICARE | Attending: Internal Medicine

## 2020-10-09 DIAGNOSIS — Z23 Encounter for immunization: Secondary | ICD-10-CM

## 2020-10-09 NOTE — Progress Notes (Signed)
   Covid-19 Vaccination Clinic  Name:  [name]    MRN: [id number] DOB: July 14, 1940  10/09/2020  [name] was observed post Covid-19 immunization for 15 minutes without incident. She was provided with Vaccine Information Sheet and instruction to access the V-Safe system.   [name] was instructed to call 911 with any severe reactions post vaccine: Marland Kitchen Difficulty breathing  . Swelling of face and throat  . A fast heartbeat  . A bad rash all over body  . Dizziness and weakness   Immunizations Administered    Name Date Dose VIS Date Route   PFIZER Comrnaty(Gray TOP) Covid-19 Vaccine 10/09/2020  9:45 AM 0.3 mL 05/02/2020 Intramuscular   Manufacturer: [employer], [employer]   Lot: [id number]   NDC: [phone]

## 2020-10-10 ENCOUNTER — Other Ambulatory Visit (HOSPITAL_COMMUNITY): Payer: Self-pay

## 2020-10-10 MED ORDER — COVID-19 MRNA VAC-TRIS(PFIZER) 30 MCG/0.3ML IM SUSP
INTRAMUSCULAR | 0 refills | Status: AC
  Filled 2020-10-10: qty 0.3, 17d supply, fill #0

## 2020-10-11 ENCOUNTER — Other Ambulatory Visit (HOSPITAL_COMMUNITY): Payer: Self-pay

## 2020-10-15 ENCOUNTER — Other Ambulatory Visit (HOSPITAL_COMMUNITY): Payer: Self-pay

## 2020-10-31 ENCOUNTER — Other Ambulatory Visit (HOSPITAL_COMMUNITY): Payer: Self-pay

## 2020-11-01 ENCOUNTER — Ambulatory Visit (HOSPITAL_COMMUNITY)
Admission: RE | Admit: 2020-11-01 | Discharge: 2020-11-01 | Disposition: A | Discharge status: Home / Self Care | Payer: MEDICARE | Source: Ambulatory Visit | Attending: Internal Medicine | Admitting: Internal Medicine

## 2020-11-01 ENCOUNTER — Other Ambulatory Visit: Payer: Self-pay

## 2020-11-01 DIAGNOSIS — M810 Age-related osteoporosis without current pathological fracture: Secondary | ICD-10-CM | POA: Insufficient documentation

## 2020-11-01 MED ORDER — ZOLEDRONIC ACID 5 MG/100ML IV SOLN: 5.0000 mg | Freq: Once | INTRAVENOUS | Status: AC

## 2020-11-01 MED ORDER — ZOLEDRONIC ACID 5 MG/100ML IV SOLN
INTRAVENOUS | Status: AC
  Administered 2020-11-01: 5 mg via INTRAVENOUS
  Filled 2020-11-01: qty 100

## 2021-01-07 DIAGNOSIS — M1711 Unilateral primary osteoarthritis, right knee: Secondary | ICD-10-CM | POA: Diagnosis not present

## 2021-01-14 DIAGNOSIS — M1711 Unilateral primary osteoarthritis, right knee: Secondary | ICD-10-CM | POA: Diagnosis not present

## 2021-01-21 DIAGNOSIS — M1711 Unilateral primary osteoarthritis, right knee: Secondary | ICD-10-CM | POA: Diagnosis not present

## 2021-04-14 DIAGNOSIS — Z23 Encounter for immunization: Secondary | ICD-10-CM | POA: Diagnosis not present

## 2021-04-14 DIAGNOSIS — I10 Essential (primary) hypertension: Secondary | ICD-10-CM | POA: Diagnosis not present

## 2021-04-14 DIAGNOSIS — K219 Gastro-esophageal reflux disease without esophagitis: Secondary | ICD-10-CM | POA: Diagnosis not present

## 2021-04-14 DIAGNOSIS — Z0000 Encounter for general adult medical examination without abnormal findings: Secondary | ICD-10-CM | POA: Diagnosis not present

## 2021-04-14 DIAGNOSIS — Z1389 Encounter for screening for other disorder: Secondary | ICD-10-CM | POA: Diagnosis not present

## 2021-04-14 DIAGNOSIS — G809 Cerebral palsy, unspecified: Secondary | ICD-10-CM | POA: Diagnosis not present

## 2021-04-14 DIAGNOSIS — M810 Age-related osteoporosis without current pathological fracture: Secondary | ICD-10-CM | POA: Diagnosis not present

## 2021-04-14 DIAGNOSIS — N1831 Chronic kidney disease, stage 3a: Secondary | ICD-10-CM | POA: Diagnosis not present

## 2021-04-21 DIAGNOSIS — Z23 Encounter for immunization: Secondary | ICD-10-CM | POA: Diagnosis not present

## 2021-05-05 DIAGNOSIS — R051 Acute cough: Secondary | ICD-10-CM | POA: Diagnosis not present

## 2021-05-05 DIAGNOSIS — J019 Acute sinusitis, unspecified: Secondary | ICD-10-CM | POA: Diagnosis not present

## 2021-06-16 DIAGNOSIS — Z1231 Encounter for screening mammogram for malignant neoplasm of breast: Secondary | ICD-10-CM | POA: Diagnosis not present

## 2021-07-01 DIAGNOSIS — R922 Inconclusive mammogram: Secondary | ICD-10-CM | POA: Diagnosis not present

## 2021-07-01 DIAGNOSIS — R921 Mammographic calcification found on diagnostic imaging of breast: Secondary | ICD-10-CM | POA: Diagnosis not present

## 2021-07-01 DIAGNOSIS — R928 Other abnormal and inconclusive findings on diagnostic imaging of breast: Secondary | ICD-10-CM | POA: Diagnosis not present

## 2021-08-28 DIAGNOSIS — M1711 Unilateral primary osteoarthritis, right knee: Secondary | ICD-10-CM | POA: Diagnosis not present

## 2021-09-04 DIAGNOSIS — M1711 Unilateral primary osteoarthritis, right knee: Secondary | ICD-10-CM | POA: Diagnosis not present

## 2021-10-07 DIAGNOSIS — I10 Essential (primary) hypertension: Secondary | ICD-10-CM | POA: Diagnosis not present

## 2021-10-07 DIAGNOSIS — M810 Age-related osteoporosis without current pathological fracture: Secondary | ICD-10-CM | POA: Diagnosis not present

## 2021-10-07 DIAGNOSIS — K219 Gastro-esophageal reflux disease without esophagitis: Secondary | ICD-10-CM | POA: Diagnosis not present

## 2021-10-30 DIAGNOSIS — M810 Age-related osteoporosis without current pathological fracture: Secondary | ICD-10-CM | POA: Diagnosis not present

## 2022-01-04 DIAGNOSIS — Z23 Encounter for immunization: Secondary | ICD-10-CM | POA: Diagnosis not present

## 2022-04-22 DIAGNOSIS — M810 Age-related osteoporosis without current pathological fracture: Secondary | ICD-10-CM | POA: Diagnosis not present

## 2022-04-22 DIAGNOSIS — G809 Cerebral palsy, unspecified: Secondary | ICD-10-CM | POA: Diagnosis not present

## 2022-04-22 DIAGNOSIS — E559 Vitamin D deficiency, unspecified: Secondary | ICD-10-CM | POA: Diagnosis not present

## 2022-04-22 DIAGNOSIS — G8929 Other chronic pain: Secondary | ICD-10-CM | POA: Diagnosis not present

## 2022-04-22 DIAGNOSIS — Z1331 Encounter for screening for depression: Secondary | ICD-10-CM | POA: Diagnosis not present

## 2022-04-22 DIAGNOSIS — K219 Gastro-esophageal reflux disease without esophagitis: Secondary | ICD-10-CM | POA: Diagnosis not present

## 2022-04-22 DIAGNOSIS — Z5181 Encounter for therapeutic drug level monitoring: Secondary | ICD-10-CM | POA: Diagnosis not present

## 2022-04-22 DIAGNOSIS — Z23 Encounter for immunization: Secondary | ICD-10-CM | POA: Diagnosis not present

## 2022-04-22 DIAGNOSIS — Z0000 Encounter for general adult medical examination without abnormal findings: Secondary | ICD-10-CM | POA: Diagnosis not present

## 2022-04-22 DIAGNOSIS — I10 Essential (primary) hypertension: Secondary | ICD-10-CM | POA: Diagnosis not present

## 2022-04-22 DIAGNOSIS — N1831 Chronic kidney disease, stage 3a: Secondary | ICD-10-CM | POA: Diagnosis not present

## 2022-04-22 DIAGNOSIS — M25561 Pain in right knee: Secondary | ICD-10-CM | POA: Diagnosis not present

## 2022-04-30 DIAGNOSIS — M1711 Unilateral primary osteoarthritis, right knee: Secondary | ICD-10-CM | POA: Diagnosis not present

## 2022-05-07 DIAGNOSIS — M1711 Unilateral primary osteoarthritis, right knee: Secondary | ICD-10-CM | POA: Diagnosis not present

## 2022-05-14 DIAGNOSIS — M1711 Unilateral primary osteoarthritis, right knee: Secondary | ICD-10-CM | POA: Diagnosis not present

## 2022-06-22 DIAGNOSIS — Z803 Family history of malignant neoplasm of breast: Secondary | ICD-10-CM | POA: Diagnosis not present

## 2022-06-22 DIAGNOSIS — M810 Age-related osteoporosis without current pathological fracture: Secondary | ICD-10-CM | POA: Diagnosis not present

## 2022-06-22 DIAGNOSIS — Z1231 Encounter for screening mammogram for malignant neoplasm of breast: Secondary | ICD-10-CM | POA: Diagnosis not present

## 2022-09-24 DIAGNOSIS — Z8781 Personal history of (healed) traumatic fracture: Secondary | ICD-10-CM | POA: Diagnosis not present

## 2022-09-24 DIAGNOSIS — I10 Essential (primary) hypertension: Secondary | ICD-10-CM | POA: Diagnosis not present

## 2022-09-24 DIAGNOSIS — M810 Age-related osteoporosis without current pathological fracture: Secondary | ICD-10-CM | POA: Diagnosis not present

## 2022-09-24 DIAGNOSIS — N1831 Chronic kidney disease, stage 3a: Secondary | ICD-10-CM | POA: Diagnosis not present

## 2022-09-28 DIAGNOSIS — I10 Essential (primary) hypertension: Secondary | ICD-10-CM | POA: Diagnosis not present

## 2022-10-23 DIAGNOSIS — Z8781 Personal history of (healed) traumatic fracture: Secondary | ICD-10-CM | POA: Diagnosis not present

## 2022-10-23 DIAGNOSIS — E559 Vitamin D deficiency, unspecified: Secondary | ICD-10-CM | POA: Diagnosis not present

## 2022-10-23 DIAGNOSIS — G8929 Other chronic pain: Secondary | ICD-10-CM | POA: Diagnosis not present

## 2022-10-23 DIAGNOSIS — N1831 Chronic kidney disease, stage 3a: Secondary | ICD-10-CM | POA: Diagnosis not present

## 2022-10-23 DIAGNOSIS — Z5181 Encounter for therapeutic drug level monitoring: Secondary | ICD-10-CM | POA: Diagnosis not present

## 2022-10-23 DIAGNOSIS — G809 Cerebral palsy, unspecified: Secondary | ICD-10-CM | POA: Diagnosis not present

## 2022-10-23 DIAGNOSIS — D649 Anemia, unspecified: Secondary | ICD-10-CM | POA: Diagnosis not present

## 2022-10-23 DIAGNOSIS — K219 Gastro-esophageal reflux disease without esophagitis: Secondary | ICD-10-CM | POA: Diagnosis not present

## 2022-10-23 DIAGNOSIS — R600 Localized edema: Secondary | ICD-10-CM | POA: Diagnosis not present

## 2022-10-23 DIAGNOSIS — I10 Essential (primary) hypertension: Secondary | ICD-10-CM | POA: Diagnosis not present

## 2022-10-23 DIAGNOSIS — M810 Age-related osteoporosis without current pathological fracture: Secondary | ICD-10-CM | POA: Diagnosis not present

## 2022-10-23 DIAGNOSIS — M25561 Pain in right knee: Secondary | ICD-10-CM | POA: Diagnosis not present

## 2022-11-04 DIAGNOSIS — M810 Age-related osteoporosis without current pathological fracture: Secondary | ICD-10-CM | POA: Diagnosis not present

## 2023-03-29 DIAGNOSIS — M810 Age-related osteoporosis without current pathological fracture: Secondary | ICD-10-CM | POA: Diagnosis not present

## 2023-03-29 DIAGNOSIS — Z8781 Personal history of (healed) traumatic fracture: Secondary | ICD-10-CM | POA: Diagnosis not present

## 2023-03-29 DIAGNOSIS — I10 Essential (primary) hypertension: Secondary | ICD-10-CM | POA: Diagnosis not present

## 2023-03-29 DIAGNOSIS — N1831 Chronic kidney disease, stage 3a: Secondary | ICD-10-CM | POA: Diagnosis not present

## 2023-03-29 DIAGNOSIS — E559 Vitamin D deficiency, unspecified: Secondary | ICD-10-CM | POA: Diagnosis not present

## 2023-03-29 DIAGNOSIS — G809 Cerebral palsy, unspecified: Secondary | ICD-10-CM | POA: Diagnosis not present

## 2023-04-30 DIAGNOSIS — Z23 Encounter for immunization: Secondary | ICD-10-CM | POA: Diagnosis not present

## 2023-04-30 DIAGNOSIS — Z8781 Personal history of (healed) traumatic fracture: Secondary | ICD-10-CM | POA: Diagnosis not present

## 2023-04-30 DIAGNOSIS — R748 Abnormal levels of other serum enzymes: Secondary | ICD-10-CM | POA: Diagnosis not present

## 2023-04-30 DIAGNOSIS — E559 Vitamin D deficiency, unspecified: Secondary | ICD-10-CM | POA: Diagnosis not present

## 2023-04-30 DIAGNOSIS — N1831 Chronic kidney disease, stage 3a: Secondary | ICD-10-CM | POA: Diagnosis not present

## 2023-04-30 DIAGNOSIS — K219 Gastro-esophageal reflux disease without esophagitis: Secondary | ICD-10-CM | POA: Diagnosis not present

## 2023-04-30 DIAGNOSIS — Z0000 Encounter for general adult medical examination without abnormal findings: Secondary | ICD-10-CM | POA: Diagnosis not present

## 2023-04-30 DIAGNOSIS — M25561 Pain in right knee: Secondary | ICD-10-CM | POA: Diagnosis not present

## 2023-04-30 DIAGNOSIS — M810 Age-related osteoporosis without current pathological fracture: Secondary | ICD-10-CM | POA: Diagnosis not present

## 2023-04-30 DIAGNOSIS — D649 Anemia, unspecified: Secondary | ICD-10-CM | POA: Diagnosis not present

## 2023-04-30 DIAGNOSIS — R600 Localized edema: Secondary | ICD-10-CM | POA: Diagnosis not present

## 2023-04-30 DIAGNOSIS — Z5181 Encounter for therapeutic drug level monitoring: Secondary | ICD-10-CM | POA: Diagnosis not present

## 2023-04-30 DIAGNOSIS — I10 Essential (primary) hypertension: Secondary | ICD-10-CM | POA: Diagnosis not present

## 2023-04-30 DIAGNOSIS — G809 Cerebral palsy, unspecified: Secondary | ICD-10-CM | POA: Diagnosis not present

## 2023-05-05 DIAGNOSIS — M545 Low back pain, unspecified: Secondary | ICD-10-CM | POA: Diagnosis not present

## 2023-05-06 DIAGNOSIS — M545 Low back pain, unspecified: Secondary | ICD-10-CM | POA: Diagnosis not present

## 2023-05-09 ENCOUNTER — Encounter (HOSPITAL_COMMUNITY): Payer: Self-pay

## 2023-05-09 ENCOUNTER — Other Ambulatory Visit: Payer: Self-pay

## 2023-05-09 ENCOUNTER — Emergency Department (HOSPITAL_COMMUNITY): Payer: MEDICARE

## 2023-05-09 ENCOUNTER — Emergency Department (HOSPITAL_COMMUNITY)
Admission: EM | Admit: 2023-05-09 | Discharge: 2023-05-09 | Disposition: A | Discharge status: Home / Self Care | Payer: MEDICARE | Attending: Emergency Medicine | Admitting: Emergency Medicine

## 2023-05-09 DIAGNOSIS — M549 Dorsalgia, unspecified: Secondary | ICD-10-CM | POA: Diagnosis not present

## 2023-05-09 DIAGNOSIS — M47816 Spondylosis without myelopathy or radiculopathy, lumbar region: Secondary | ICD-10-CM

## 2023-05-09 DIAGNOSIS — S32019A Unspecified fracture of first lumbar vertebra, initial encounter for closed fracture: Secondary | ICD-10-CM | POA: Insufficient documentation

## 2023-05-09 DIAGNOSIS — K449 Diaphragmatic hernia without obstruction or gangrene: Secondary | ICD-10-CM | POA: Diagnosis not present

## 2023-05-09 DIAGNOSIS — M5127 Other intervertebral disc displacement, lumbosacral region: Secondary | ICD-10-CM | POA: Diagnosis not present

## 2023-05-09 DIAGNOSIS — M1909 Primary osteoarthritis, other specified site: Secondary | ICD-10-CM | POA: Insufficient documentation

## 2023-05-09 DIAGNOSIS — N390 Urinary tract infection, site not specified: Secondary | ICD-10-CM | POA: Insufficient documentation

## 2023-05-09 DIAGNOSIS — B9689 Other specified bacterial agents as the cause of diseases classified elsewhere: Secondary | ICD-10-CM | POA: Insufficient documentation

## 2023-05-09 DIAGNOSIS — S32010A Wedge compression fracture of first lumbar vertebra, initial encounter for closed fracture: Secondary | ICD-10-CM

## 2023-05-09 DIAGNOSIS — Z7982 Long term (current) use of aspirin: Secondary | ICD-10-CM | POA: Insufficient documentation

## 2023-05-09 DIAGNOSIS — X58XXXA Exposure to other specified factors, initial encounter: Secondary | ICD-10-CM | POA: Diagnosis not present

## 2023-05-09 DIAGNOSIS — M51369 Other intervertebral disc degeneration, lumbar region without mention of lumbar back pain or lower extremity pain: Secondary | ICD-10-CM | POA: Diagnosis not present

## 2023-05-09 DIAGNOSIS — S3992XA Unspecified injury of lower back, initial encounter: Secondary | ICD-10-CM | POA: Diagnosis present

## 2023-05-09 DIAGNOSIS — K8689 Other specified diseases of pancreas: Secondary | ICD-10-CM | POA: Diagnosis not present

## 2023-05-09 DIAGNOSIS — N200 Calculus of kidney: Secondary | ICD-10-CM | POA: Diagnosis not present

## 2023-05-09 DIAGNOSIS — I10 Essential (primary) hypertension: Secondary | ICD-10-CM | POA: Diagnosis not present

## 2023-05-09 DIAGNOSIS — A0472 Enterocolitis due to Clostridium difficile, not specified as recurrent: Secondary | ICD-10-CM | POA: Insufficient documentation

## 2023-05-09 DIAGNOSIS — I728 Aneurysm of other specified arteries: Secondary | ICD-10-CM | POA: Diagnosis not present

## 2023-05-09 LAB — CBC
HCT: 32.3 % — ABNORMAL LOW (ref 36.0–46.0)
Hemoglobin: 11.1 g/dL — ABNORMAL LOW (ref 12.0–15.0)
MCH: 32 pg (ref 26.0–34.0)
MCHC: 34.4 g/dL (ref 30.0–36.0)
MCV: 93.1 fL (ref 80.0–100.0)
Platelets: 141 10*3/uL — ABNORMAL LOW (ref 150–400)
RBC: 3.47 MIL/uL — ABNORMAL LOW (ref 3.87–5.11)
RDW: 12.2 % (ref 11.5–15.5)
WBC: 5.7 10*3/uL (ref 4.0–10.5)
nRBC: 0 % (ref 0.0–0.2)

## 2023-05-09 LAB — URINALYSIS, ROUTINE W REFLEX MICROSCOPIC
Bilirubin Urine: NEGATIVE
Glucose, UA: NEGATIVE mg/dL
Ketones, ur: NEGATIVE mg/dL
Nitrite: NEGATIVE
Protein, ur: NEGATIVE mg/dL
Specific Gravity, Urine: 1.027 (ref 1.005–1.030)
WBC, UA: >50 WBC/hpf (ref 0–5)
pH: 5 (ref 5.0–8.0)

## 2023-05-09 LAB — COMPREHENSIVE METABOLIC PANEL
ALT: 10 U/L (ref 0–44)
AST: 15 U/L (ref 15–41)
Albumin: 3.5 g/dL (ref 3.5–5.0)
Alkaline Phosphatase: 62 U/L (ref 38–126)
Anion gap: 10 (ref 5–15)
BUN: 20 mg/dL (ref 8–23)
CO2: 24 mmol/L (ref 22–32)
Calcium: 8.5 mg/dL — ABNORMAL LOW (ref 8.9–10.3)
Chloride: 102 mmol/L (ref 98–111)
Creatinine, Ser: 1.37 mg/dL — ABNORMAL HIGH (ref 0.44–1.00)
GFR, Estimated: 38 mL/min — ABNORMAL LOW (ref >60)
Glucose, Bld: 107 mg/dL — ABNORMAL HIGH (ref 70–99)
Potassium: 3.8 mmol/L (ref 3.5–5.1)
Sodium: 136 mmol/L (ref 135–145)
Total Bilirubin: 0.6 mg/dL (ref <1.2)
Total Protein: 7.4 g/dL (ref 6.5–8.1)

## 2023-05-09 LAB — LIPASE, BLOOD: Lipase: 22 U/L (ref 11–51)

## 2023-05-09 MED ORDER — IOHEXOL 300 MG/ML  SOLN
80.0000 mL | Freq: Once | INTRAMUSCULAR | Status: AC | PRN
  Administered 2023-05-09: 80 mL via INTRAVENOUS

## 2023-05-09 MED ORDER — CEPHALEXIN 500 MG PO CAPS: 500.0000 mg | ORAL_CAPSULE | Freq: Four times a day (QID) | ORAL | 0 refills | Status: AC

## 2023-05-09 MED ORDER — ONDANSETRON HCL 4 MG/2ML IJ SOLN
4.0000 mg | Freq: Once | INTRAMUSCULAR | Status: AC
  Administered 2023-05-09: 4 mg via INTRAVENOUS
  Filled 2023-05-09: qty 2

## 2023-05-09 MED ORDER — LIDOCAINE 5 % EX PTCH: 1.0000 | MEDICATED_PATCH | CUTANEOUS | 0 refills | Status: AC

## 2023-05-09 MED ORDER — OXYCODONE-ACETAMINOPHEN 5-325 MG PO TABS
1.0000 | ORAL_TABLET | Freq: Once | ORAL | Status: AC
  Administered 2023-05-09: 1 via ORAL
  Filled 2023-05-09: qty 1

## 2023-05-09 MED ORDER — HYDROCODONE-ACETAMINOPHEN 5-325 MG PO TABS: 1.0000 | ORAL_TABLET | Freq: Four times a day (QID) | ORAL | 0 refills | Status: AC | PRN

## 2023-05-09 MED ORDER — MORPHINE SULFATE (PF) 4 MG/ML IV SOLN
4.0000 mg | Freq: Once | INTRAVENOUS | Status: AC
  Administered 2023-05-09: 4 mg via INTRAVENOUS
  Filled 2023-05-09: qty 1

## 2023-05-09 NOTE — ED Triage Notes (Signed)
Pt BIB [facility] from home for back/flank pain worse starting on Monday. Hx previous spine fx. Dx with possible kidney stone on Weds. Trying to get in w kidney specialists. Denies hematuria, reports oliguria. Weaker last few days.  130/80 96% RA CBG 121

## 2023-05-09 NOTE — ED Provider Notes (Signed)
[place] EMERGENCY DEPARTMENT AT [facility] Provider Note   CSN: [id number] Arrival date & time: 05/09/23  1031     History {Add pertinent medical, surgical, social history, OB history to HPI:1} Chief Complaint  Patient presents with   Back Pain   Flank Pain    [name] is a 82 y.o. female patient with past medical history of osteopenia, GERD, vitamin D deficiency reporting to emergency room with back pain and abdominal pain.  Patient reports [name] has had no recent injuries trauma or fall.  Patient reports back pain started on the left side felt the pain started out of nowhere and has gradually gotten worse.  Patient reports pain is worse with movement and with ambulation.  Patient denies any numbness or tingling radiating down legs however [name] reports generalized weakness.  Patient also reports abdominal pain abdominal pain is generalized and cramping.  Patient reports that this started on the right side around the time back pain started and has moved generally throughout abdomen.  Denies any associated nausea vomiting diarrhea.  Denies any saddle anesthesia, has not had any urinary incontinence.  [name] does not feel that [name] is retaining urine however reports decreased urine output.   Back Pain Flank Pain       Home Medications Prior to Admission medications   Medication Sig Start Date End Date Taking? Authorizing Provider  acetaminophen (TYLENOL) 500 MG tablet Take 1,000 mg by mouth every 6 (six) hours as needed for mild pain or moderate pain.    [provider]  alendronate (FOSAMAX) 70 MG tablet Take 70 mg by mouth once a week. Thursday Patient not taking: No sig reported 05/31/19   [provider]  amLODipine (NORVASC) 2.5 MG tablet Take 1 tablet (2.5 mg total) by mouth daily. 06/24/19   Rhetta Mura, MD  amLODipine (NORVASC) 5 MG tablet Take 5 mg by mouth daily. 09/25/19   [provider]  aspirin 81 MG EC tablet daily.     [provider]  Cholecalciferol (VITAMIN D3 PO) Take 1 tablet by mouth at bedtime.    [provider]  COVID-19 mRNA Vac-TriS, Pfizer, SUSP injection Inject into the muscle. 10/09/20   Judyann Munson, MD  esomeprazole (NEXIUM) 40 MG capsule Take 40 mg by mouth daily.  04/22/19   [provider]  HYDROcodone-acetaminophen (NORCO/VICODIN) 5-325 MG tablet Take 1 tablet by mouth every 4 (four) hours as needed for moderate pain. Patient not taking: No sig reported 06/21/19   Samson Frederic, MD  methocarbamol (ROBAXIN) 500 MG tablet Take 1 tablet (500 mg total) by mouth every 6 (six) hours as needed for muscle spasms. Patient not taking: No sig reported 06/23/19   Rhetta Mura, MD  Multiple Vitamins-Minerals (MULTIVITAMIN WITH MINERALS) tablet Take 1 tablet by mouth daily.    [provider]  ondansetron (ZOFRAN ODT) 4 MG disintegrating tablet Take 1 tablet (4 mg total) by mouth every 8 (eight) hours as needed for nausea or vomiting. Patient not taking: No sig reported 02/12/19   Dahlia Byes A, NP  polyethylene glycol (MIRALAX / GLYCOLAX) 17 g packet Take 17 g by mouth daily as needed for mild constipation. Patient not taking: No sig reported 06/23/19   Rhetta Mura, MD      Allergies    Sulfa drugs cross reactors    Review of Systems   Review of Systems  Genitourinary:  Positive for flank pain.  Musculoskeletal:  Positive for back pain.    Physical  Exam Updated Vital Signs BP (!) 144/62 (BP Location: Right Arm)   Pulse 87   Temp 99.5 F (37.5 C) (Oral)   Resp 20   Ht 5\' 2"  (1.575 m)   Wt 70.3 kg   SpO2 100%   BMI 28.35 kg/m  Physical Exam Vitals and nursing note reviewed.  Constitutional:      General: [name] is not in acute distress.    Appearance: [name] is not toxic-appearing.  HENT:     Head: Normocephalic and atraumatic.  Eyes:     General: No scleral icterus.    Conjunctiva/sclera: Conjunctivae normal.  Cardiovascular:      Rate and Rhythm: Normal rate and regular rhythm.     Pulses: Normal pulses.     Heart sounds: Normal heart sounds.  Pulmonary:     Effort: Pulmonary effort is normal. No respiratory distress.     Breath sounds: Normal breath sounds.  Abdominal:     General: Abdomen is flat. Bowel sounds are normal.     Palpations: Abdomen is soft.     Tenderness: There is no abdominal tenderness.  Musculoskeletal:     Right lower leg: No edema.     Left lower leg: No edema.  Skin:    General: Skin is warm and dry.     Findings: No lesion.  Neurological:     General: No focal deficit present.     Mental Status: [name] is alert and oriented to person, place, and time. Mental status is at baseline.     ED Results / Procedures / Treatments   Labs (all labs ordered are listed, but only abnormal results are displayed) Labs Reviewed  URINALYSIS, ROUTINE W REFLEX MICROSCOPIC - Abnormal; Notable for the following components:      Result Value   APPearance CLOUDY (*)    Hgb urine dipstick MODERATE (*)    Leukocytes,Ua LARGE (*)    Bacteria, UA MANY (*)    All other components within normal limits  CBC - Abnormal; Notable for the following components:   RBC 3.47 (*)    Hemoglobin 11.1 (*)    HCT 32.3 (*)    Platelets 141 (*)    All other components within normal limits  COMPREHENSIVE METABOLIC PANEL - Abnormal; Notable for the following components:   Glucose, Bld 107 (*)    Creatinine, Ser 1.37 (*)    Calcium 8.5 (*)    GFR, Estimated 38 (*)    All other components within normal limits  URINE CULTURE  LIPASE, BLOOD    EKG None  Radiology CT L-SPINE NO CHARGE Result Date: 05/09/2023 CLINICAL DATA:  Back pain. EXAM: CT LUMBAR SPINE WITHOUT CONTRAST TECHNIQUE: Multidetector CT imaging of the lumbar spine was performed without intravenous contrast administration. Multiplanar CT image reconstructions were also generated. RADIATION DOSE REDUCTION: This exam was performed according to the  departmental dose-optimization program which includes automated exposure control, adjustment of the mA and/or kV according to patient size and/or use of iterative reconstruction technique. COMPARISON:  CT scan from 2017 FINDINGS: Segmentation: There are five lumbar type vertebral bodies. The last full intervertebral disc space is labeled L5-S1. Alignment: Normal Vertebrae: Remote compression fracture of L1. No acute fracture. No worrisome bone lesions. Moderate osteoporosis. No pars defects. Paraspinal and other soft tissues: No significant paraspinal retroperitoneal findings. Age related vascular disease. Disc levels: T12-L1: No significant findings. L1-2: No significant findings. L2-3: Annular bulge and mild facet disease but no spinal foraminal stenosis. Mild bilateral lateral recess encroachment.  L3-4: Annular bulge and mild facet disease with mild bilateral lateral recess stenosis and early spinal stenosis. L4-5: Right paracentral disc osteophyte complex with mild mass effect on the right side of the thecal sac and right foraminal stenosis likely affecting the right L4 nerve root. Moderate facet disease, right greater than is contributory. L5-S1: Mild diffuse annular bulge. Shallow right foraminal disc osteophyte complex potentially irritating right L5 nerve root. IMPRESSION: 1. Remote compression fracture of L1. No acute fracture. 2. Degenerative disc and facet disease at L2-3 and L3-4. 3. Right paracentral disc osteophyte complexes at L4-5 and L5-S1 with mild mass effect on the right side of the thecal sac and right foraminal stenosis likely affecting the right L4 and L5 nerve roots respectively. Electronically Signed   By: Rudie Meyer M.D.   On: 05/09/2023 13:06   CT ABDOMEN PELVIS W CONTRAST Result Date: 05/09/2023 CLINICAL DATA:  Left lower quadrant and back pain. EXAM: CT ABDOMEN AND PELVIS WITH CONTRAST TECHNIQUE: Multidetector CT imaging of the abdomen and pelvis was performed using the standard  protocol following bolus administration of intravenous contrast. RADIATION DOSE REDUCTION: This exam was performed according to the departmental dose-optimization program which includes automated exposure control, adjustment of the mA and/or kV according to patient size and/or use of iterative reconstruction technique. CONTRAST:  80mL OMNIPAQUE IOHEXOL 300 MG/ML  SOLN COMPARISON:  None Available. FINDINGS: Lower chest: The heart is within normal limits in size for age. No pericardial effusion. Scattered aortic calcifications. Small hiatal hernia. Hepatobiliary: No focal liver abnormality is seen. Status post cholecystectomy. No biliary dilatation. Pancreas: Moderate pancreatic atrophy but no mass, inflammation or ductal dilatation. Spleen: Normal in size without focal abnormality. Adrenals/Urinary Tract: The adrenal glands are normal. No renal, ureteral or bladder calculi or mass. No collecting system abnormalities are identified on the delayed images. Stomach/Bowel: The stomach, duodenum, small bowel and colon are grossly normal without oral contrast. No inflammatory changes, mass lesions or obstructive findings. Vascular/Lymphatic: Age related atherosclerotic calcifications involving the aorta and branch vessels but no aneurysm or dissection. 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 The largest measures 12 mm. The major venous structures are patent. No abdominal or pelvic lymphadenopathy. Reproductive: The uterus and ovaries are normal. Other: No pelvic mass or adenopathy. No free pelvic fluid collections. No inguinal mass or adenopathy. No abdominal wall hernia or subcutaneous lesions. Musculoskeletal: No significant bony findings. Age related degenerative lumbar spondylosis with multilevel facet disease. Remote compression fracture of L1 is noted. Total left hip arthroplasty noted. Sclerotic lesion in the right pubic bone likely benign bone island. IMPRESSION: 1. No acute abdominal/pelvic findings,  mass lesions or adenopathy. 2. Status post cholecystectomy. No biliary dilatation. 3. Age related degenerative lumbar spondylosis with multilevel facet disease. Remote compression fracture of L1. 4. Aortic atherosclerosis. Aortic Atherosclerosis (ICD10-I70.0). Electronically Signed   By: Rudie Meyer M.D.   On: 05/09/2023 12:55    Procedures Procedures  {Document cardiac monitor, telemetry assessment procedure when appropriate:1}  Medications Ordered in ED Medications - No data to display  ED Course/ Medical Decision Making/ A&P   {   Click here for ABCD2, HEART and other calculatorsREFRESH Note before signing :1}                              Medical Decision Making Amount and/or Complexity of Data Reviewed Labs: ordered. Radiology: ordered.  Risk Prescription drug management.   Mosie Lukes 82 y.o. presented  today for abd pain. Working DDx includes, but not limited to, gastroenteritis, colitis, SBO, appendicitis, cholecystitis, hepatobiliary pathology, gastritis, PUD, ACS, dissection, pancreatitis, nephrolithiasis, AAA, UTI, pyelonephritis  R/o DDx: These are considered less likely than current impression due to history of present illness, physical exam, labs/imaging findings.  Review of prior external notes: ***  Pmhx:   Unique Tests and My Interpretation:  CBC with differential: *** CMP: *** Lipase: *** UA: ***  EKG: Rate, rhythm, axis, intervals all examined: ***  Imaging:  CT Abd/Pelvis with contrast: evaluate for structural/surgical etiology of patients' severe abdominal pain.  CT Lumbar:    Problem List / ED Course / Critical interventions / Medication management  *** I ordered medication including ***  for ***  Reevaluation of the patient after these medicines showed that the patient {resolved/improved/worsened:23923::"improved"} Patients vitals assessed. Upon arrival patient is hemodynamically stable.  I have reviewed the patients home medicines and have  made adjustments as needed   Consult: ***  Plan:    {Document critical care time when appropriate:1} {Document review of labs and clinical decision tools ie heart score, Chads2Vasc2 etc:1}  {Document your independent review of radiology images, and any outside records:1} {Document your discussion with family members, caretakers, and with consultants:1} {Document social determinants of health affecting pt's care:1} {Document your decision making why or why not admission, treatments were needed:1} Final Clinical Impression(s) / ED Diagnoses Final diagnoses:  None    Rx / DC Orders ED Discharge Orders     None

## 2023-05-09 NOTE — Discharge Instructions (Addendum)
You are seen in the emergency room today for back pain.  Please follow-up with [facility]. I have sent pain medication to your pharmacy, please take as prescribed. Return to emergency room with new or worsening symptoms.

## 2023-05-09 NOTE — ED Notes (Signed)
Patient transported to CT 

## 2023-05-11 LAB — URINE CULTURE: Culture: >=100000 — AB

## 2023-05-12 ENCOUNTER — Telehealth (HOSPITAL_BASED_OUTPATIENT_CLINIC_OR_DEPARTMENT_OTHER): Payer: Self-pay | Admitting: *Deleted

## 2023-05-12 NOTE — Telephone Encounter (Signed)
Post ED Visit - Positive Culture Follow-up  Culture report reviewed by antimicrobial stewardship pharmacist: [facility] []  Enzo Bi, Pharm.D. []  Celedonio Miyamoto, Pharm.D., BCPS AQ-ID []  Garvin Fila, Pharm.D., BCPS []  Georgina Pillion, [address].D., BCPS []  [place], [address].D., BCPS, AAHIVP []  Estella Husk, Pharm.D., BCPS, AAHIVP []  Lysle Pearl, PharmD, BCPS []  Phillips Climes, PharmD, BCPS []  Agapito Games, PharmD, BCPS []  Verlan Friends, PharmD []  Mervyn Gay, PharmD, BCPS []  Vinnie Level, PharmD  [facility] [x]  Sharin Mons, PharmD []  Greer Pickerel, PharmD []  Adalberto Cole, PharmD []  Perlie Gold, Rph []  Lonell Face) Jean Rosenthal, PharmD []  Earl Many, PharmD []  Junita Push, PharmD []  Dorna Leitz, PharmD []  Terrilee Files, PharmD []  Lynann Beaver, PharmD []  Keturah Barre, PharmD []  Loralee Pacas, PharmD []  Bernadene Person, PharmD   Positive urine culture Treated with cephalexin, organism sensitive to the same and no further patient follow-up is required at this time.  Nena Polio Garner Nash 05/12/2023, 10:00 AM

## 2023-06-01 ENCOUNTER — Other Ambulatory Visit (HOSPITAL_COMMUNITY): Payer: Self-pay | Admitting: Nephrology

## 2023-06-01 DIAGNOSIS — I10 Essential (primary) hypertension: Secondary | ICD-10-CM | POA: Diagnosis not present

## 2023-06-01 DIAGNOSIS — N1832 Chronic kidney disease, stage 3b: Secondary | ICD-10-CM

## 2023-06-08 ENCOUNTER — Ambulatory Visit (HOSPITAL_COMMUNITY)
Admission: RE | Admit: 2023-06-08 | Discharge: 2023-06-08 | Disposition: A | Discharge status: Home / Self Care | Payer: MEDICARE | Source: Ambulatory Visit | Attending: Nephrology | Admitting: Nephrology

## 2023-06-08 DIAGNOSIS — N1832 Chronic kidney disease, stage 3b: Secondary | ICD-10-CM | POA: Insufficient documentation

## 2023-06-08 DIAGNOSIS — I10 Essential (primary) hypertension: Secondary | ICD-10-CM | POA: Insufficient documentation

## 2023-06-25 DIAGNOSIS — Z1231 Encounter for screening mammogram for malignant neoplasm of breast: Secondary | ICD-10-CM | POA: Diagnosis not present

## 2023-06-29 DIAGNOSIS — N1832 Chronic kidney disease, stage 3b: Secondary | ICD-10-CM | POA: Diagnosis not present

## 2023-06-29 DIAGNOSIS — I10 Essential (primary) hypertension: Secondary | ICD-10-CM | POA: Diagnosis not present

## 2023-09-27 DIAGNOSIS — E559 Vitamin D deficiency, unspecified: Secondary | ICD-10-CM | POA: Diagnosis not present

## 2023-09-27 DIAGNOSIS — I10 Essential (primary) hypertension: Secondary | ICD-10-CM | POA: Diagnosis not present

## 2023-09-27 DIAGNOSIS — Z8781 Personal history of (healed) traumatic fracture: Secondary | ICD-10-CM | POA: Diagnosis not present

## 2023-09-27 DIAGNOSIS — M810 Age-related osteoporosis without current pathological fracture: Secondary | ICD-10-CM | POA: Diagnosis not present

## 2023-09-27 DIAGNOSIS — N1831 Chronic kidney disease, stage 3a: Secondary | ICD-10-CM | POA: Diagnosis not present

## 2023-11-01 DIAGNOSIS — I10 Essential (primary) hypertension: Secondary | ICD-10-CM | POA: Diagnosis not present

## 2023-11-01 DIAGNOSIS — N1832 Chronic kidney disease, stage 3b: Secondary | ICD-10-CM | POA: Diagnosis not present

## 2023-11-02 DIAGNOSIS — Z7409 Other reduced mobility: Secondary | ICD-10-CM | POA: Diagnosis not present

## 2023-11-02 DIAGNOSIS — G809 Cerebral palsy, unspecified: Secondary | ICD-10-CM | POA: Diagnosis not present

## 2023-11-02 DIAGNOSIS — N1832 Chronic kidney disease, stage 3b: Secondary | ICD-10-CM | POA: Diagnosis not present

## 2023-11-02 DIAGNOSIS — E559 Vitamin D deficiency, unspecified: Secondary | ICD-10-CM | POA: Diagnosis not present

## 2023-11-02 DIAGNOSIS — I10 Essential (primary) hypertension: Secondary | ICD-10-CM | POA: Diagnosis not present

## 2023-11-02 DIAGNOSIS — M810 Age-related osteoporosis without current pathological fracture: Secondary | ICD-10-CM | POA: Diagnosis not present

## 2023-11-02 DIAGNOSIS — Z8781 Personal history of (healed) traumatic fracture: Secondary | ICD-10-CM | POA: Diagnosis not present

## 2023-11-09 DIAGNOSIS — N1832 Chronic kidney disease, stage 3b: Secondary | ICD-10-CM | POA: Diagnosis not present

## 2023-11-09 DIAGNOSIS — I10 Essential (primary) hypertension: Secondary | ICD-10-CM | POA: Diagnosis not present

## 2023-11-22 DIAGNOSIS — M810 Age-related osteoporosis without current pathological fracture: Secondary | ICD-10-CM | POA: Diagnosis not present

## 2023-12-02 DIAGNOSIS — M1711 Unilateral primary osteoarthritis, right knee: Secondary | ICD-10-CM | POA: Diagnosis not present

## 2023-12-08 DIAGNOSIS — M1711 Unilateral primary osteoarthritis, right knee: Secondary | ICD-10-CM | POA: Diagnosis not present

## 2023-12-16 DIAGNOSIS — M1711 Unilateral primary osteoarthritis, right knee: Secondary | ICD-10-CM | POA: Diagnosis not present

## 2024-03-27 DIAGNOSIS — N1832 Chronic kidney disease, stage 3b: Secondary | ICD-10-CM | POA: Diagnosis not present

## 2024-03-27 DIAGNOSIS — I10 Essential (primary) hypertension: Secondary | ICD-10-CM | POA: Diagnosis not present

## 2024-03-30 DIAGNOSIS — Z23 Encounter for immunization: Secondary | ICD-10-CM | POA: Diagnosis not present

## 2024-03-30 DIAGNOSIS — Z8781 Personal history of (healed) traumatic fracture: Secondary | ICD-10-CM | POA: Diagnosis not present

## 2024-03-30 DIAGNOSIS — I10 Essential (primary) hypertension: Secondary | ICD-10-CM | POA: Diagnosis not present

## 2024-03-30 DIAGNOSIS — N1831 Chronic kidney disease, stage 3a: Secondary | ICD-10-CM | POA: Diagnosis not present

## 2024-03-30 DIAGNOSIS — M810 Age-related osteoporosis without current pathological fracture: Secondary | ICD-10-CM | POA: Diagnosis not present

## 2024-04-04 DIAGNOSIS — D631 Anemia in chronic kidney disease: Secondary | ICD-10-CM | POA: Diagnosis not present

## 2024-04-04 DIAGNOSIS — N1832 Chronic kidney disease, stage 3b: Secondary | ICD-10-CM | POA: Diagnosis not present

## 2024-04-04 DIAGNOSIS — I10 Essential (primary) hypertension: Secondary | ICD-10-CM | POA: Diagnosis not present

## 2024-04-05 DIAGNOSIS — H538 Other visual disturbances: Secondary | ICD-10-CM | POA: Diagnosis not present

## 2024-04-05 DIAGNOSIS — Z961 Presence of intraocular lens: Secondary | ICD-10-CM | POA: Diagnosis not present

## 2024-05-05 DIAGNOSIS — Z0000 Encounter for general adult medical examination without abnormal findings: Secondary | ICD-10-CM | POA: Diagnosis not present

## 2024-05-05 DIAGNOSIS — Z23 Encounter for immunization: Secondary | ICD-10-CM | POA: Diagnosis not present

## 2024-05-05 DIAGNOSIS — Z1331 Encounter for screening for depression: Secondary | ICD-10-CM | POA: Diagnosis not present
